# Patient Record
Sex: Female | Born: 1975
Health system: Southern US, Community
[De-identification: ages and names within clinical notes are randomized; demographics above are authoritative.]

## PROBLEM LIST (undated history)

## (undated) DIAGNOSIS — E039 Hypothyroidism, unspecified: Secondary | ICD-10-CM

## (undated) DIAGNOSIS — I499 Cardiac arrhythmia, unspecified: Secondary | ICD-10-CM

## (undated) DIAGNOSIS — J3089 Other allergic rhinitis: Secondary | ICD-10-CM

## (undated) DIAGNOSIS — R51 Headache: Secondary | ICD-10-CM

## (undated) DIAGNOSIS — E785 Hyperlipidemia, unspecified: Secondary | ICD-10-CM

## (undated) DIAGNOSIS — R7303 Prediabetes: Secondary | ICD-10-CM

## (undated) DIAGNOSIS — R519 Headache, unspecified: Secondary | ICD-10-CM

## (undated) DIAGNOSIS — E669 Obesity, unspecified: Secondary | ICD-10-CM

## (undated) DIAGNOSIS — J45909 Unspecified asthma, uncomplicated: Secondary | ICD-10-CM

## (undated) DIAGNOSIS — R000 Tachycardia, unspecified: Secondary | ICD-10-CM

## (undated) DIAGNOSIS — Z8619 Personal history of other infectious and parasitic diseases: Secondary | ICD-10-CM

## (undated) HISTORY — DX: Other allergic rhinitis: J30.89

## (undated) HISTORY — DX: Hypothyroidism, unspecified: E03.9

## (undated) HISTORY — DX: Prediabetes: R73.03

## (undated) HISTORY — DX: Headache: R51

## (undated) HISTORY — DX: Obesity, unspecified: E66.9

## (undated) HISTORY — DX: Unspecified asthma, uncomplicated: J45.909

## (undated) HISTORY — DX: Hyperlipidemia, unspecified: E78.5

## (undated) HISTORY — DX: Cardiac arrhythmia, unspecified: I49.9

## (undated) HISTORY — DX: Tachycardia, unspecified: R00.0

## (undated) HISTORY — DX: Headache, unspecified: R51.9

## (undated) HISTORY — DX: Personal history of other infectious and parasitic diseases: Z86.19

---

## 1997-05-07 HISTORY — PX: APPENDECTOMY: SHX54

## 1997-11-03 ENCOUNTER — Other Ambulatory Visit: Admission: RE | Admit: 1997-11-03 | Discharge: 1997-11-03 | Payer: Self-pay | Admitting: Obstetrics and Gynecology

## 1998-05-07 ENCOUNTER — Inpatient Hospital Stay (HOSPITAL_COMMUNITY): Admission: AD | Admit: 1998-05-07 | Discharge: 1998-05-09 | Payer: Self-pay | Admitting: Obstetrics and Gynecology

## 1998-10-21 ENCOUNTER — Other Ambulatory Visit: Admission: RE | Admit: 1998-10-21 | Discharge: 1998-10-21 | Payer: Self-pay | Admitting: Obstetrics and Gynecology

## 1999-12-06 ENCOUNTER — Other Ambulatory Visit: Admission: RE | Admit: 1999-12-06 | Discharge: 1999-12-06 | Payer: Self-pay | Admitting: Obstetrics and Gynecology

## 2001-01-28 ENCOUNTER — Other Ambulatory Visit: Admission: RE | Admit: 2001-01-28 | Discharge: 2001-01-28 | Payer: Self-pay | Admitting: Obstetrics and Gynecology

## 2002-08-04 ENCOUNTER — Other Ambulatory Visit: Admission: RE | Admit: 2002-08-04 | Discharge: 2002-08-04 | Payer: Self-pay | Admitting: Obstetrics and Gynecology

## 2003-05-04 ENCOUNTER — Encounter: Admission: RE | Admit: 2003-05-04 | Discharge: 2003-05-04 | Payer: Self-pay | Admitting: Urology

## 2003-08-06 ENCOUNTER — Other Ambulatory Visit: Admission: RE | Admit: 2003-08-06 | Discharge: 2003-08-06 | Payer: Self-pay | Admitting: Obstetrics and Gynecology

## 2004-09-13 ENCOUNTER — Other Ambulatory Visit: Admission: RE | Admit: 2004-09-13 | Discharge: 2004-09-13 | Payer: Self-pay | Admitting: Obstetrics and Gynecology

## 2013-03-06 ENCOUNTER — Encounter: Payer: Self-pay | Admitting: Family Medicine

## 2013-03-06 ENCOUNTER — Ambulatory Visit (INDEPENDENT_AMBULATORY_CARE_PROVIDER_SITE_OTHER): Payer: BC Managed Care – PPO | Admitting: Family Medicine

## 2013-03-06 VITALS — BP 128/84 | HR 72 | Temp 98.1°F | Ht 66.0 in | Wt 221.8 lb

## 2013-03-06 DIAGNOSIS — J302 Other seasonal allergic rhinitis: Secondary | ICD-10-CM | POA: Insufficient documentation

## 2013-03-06 DIAGNOSIS — J45909 Unspecified asthma, uncomplicated: Secondary | ICD-10-CM

## 2013-03-06 DIAGNOSIS — E669 Obesity, unspecified: Secondary | ICD-10-CM

## 2013-03-06 DIAGNOSIS — R5383 Other fatigue: Secondary | ICD-10-CM

## 2013-03-06 DIAGNOSIS — Z23 Encounter for immunization: Secondary | ICD-10-CM

## 2013-03-06 DIAGNOSIS — J309 Allergic rhinitis, unspecified: Secondary | ICD-10-CM

## 2013-03-06 DIAGNOSIS — G4733 Obstructive sleep apnea (adult) (pediatric): Secondary | ICD-10-CM | POA: Insufficient documentation

## 2013-03-06 DIAGNOSIS — R5381 Other malaise: Secondary | ICD-10-CM

## 2013-03-06 DIAGNOSIS — G479 Sleep disorder, unspecified: Secondary | ICD-10-CM

## 2013-03-06 DIAGNOSIS — J3089 Other allergic rhinitis: Secondary | ICD-10-CM | POA: Insufficient documentation

## 2013-03-06 DIAGNOSIS — E039 Hypothyroidism, unspecified: Secondary | ICD-10-CM | POA: Insufficient documentation

## 2013-03-06 HISTORY — DX: Obesity, unspecified: E66.9

## 2013-03-06 LAB — BASIC METABOLIC PANEL
BUN: 9 mg/dL (ref 6–23)
CO2: 26 mEq/L (ref 19–32)
Calcium: 9.4 mg/dL (ref 8.4–10.5)
Chloride: 103 mEq/L (ref 96–112)
Creatinine, Ser: 0.7 mg/dL (ref 0.4–1.2)
GFR: 93.15 mL/min (ref >60.00)
Glucose, Bld: 87 mg/dL (ref 70–99)
Potassium: 4 mEq/L (ref 3.5–5.1)
Sodium: 137 mEq/L (ref 135–145)

## 2013-03-06 LAB — LIPID PANEL
Cholesterol: 179 mg/dL (ref 0–200)
HDL: 51.1 mg/dL (ref >39.00)
LDL Cholesterol: 109 mg/dL — ABNORMAL HIGH (ref 0–99)
Total CHOL/HDL Ratio: 4
Triglycerides: 97 mg/dL (ref 0.0–149.0)
VLDL: 19.4 mg/dL (ref 0.0–40.0)

## 2013-03-06 LAB — CBC WITH DIFFERENTIAL/PLATELET
Basophils Absolute: 0 10*3/uL (ref 0.0–0.1)
Basophils Relative: 0.6 % (ref 0.0–3.0)
Eosinophils Absolute: 0.6 10*3/uL (ref 0.0–0.7)
Eosinophils Relative: 6.8 % — ABNORMAL HIGH (ref 0.0–5.0)
HCT: 39.3 % (ref 36.0–46.0)
Hemoglobin: 13.2 g/dL (ref 12.0–15.0)
Lymphocytes Relative: 31.1 % (ref 12.0–46.0)
Lymphs Abs: 2.6 10*3/uL (ref 0.7–4.0)
MCHC: 33.7 g/dL (ref 30.0–36.0)
MCV: 86.3 fl (ref 78.0–100.0)
Monocytes Absolute: 0.3 10*3/uL (ref 0.1–1.0)
Monocytes Relative: 3.6 % (ref 3.0–12.0)
Neutro Abs: 4.8 10*3/uL (ref 1.4–7.7)
Neutrophils Relative %: 57.9 % (ref 43.0–77.0)
Platelets: 262 10*3/uL (ref 150.0–400.0)
RBC: 4.55 Mil/uL (ref 3.87–5.11)
RDW: 12.6 % (ref 11.5–14.6)
WBC: 8.3 10*3/uL (ref 4.5–10.5)

## 2013-03-06 LAB — TSH: TSH: 2.11 u[IU]/mL (ref 0.35–5.50)

## 2013-03-06 NOTE — Assessment & Plan Note (Signed)
Stable on allegra.  ?

## 2013-03-06 NOTE — Assessment & Plan Note (Signed)
Body mass index is 35.81 kg/(m^2).  Discussed watching weight.

## 2013-03-06 NOTE — Assessment & Plan Note (Signed)
Check TSH today

## 2013-03-06 NOTE — Assessment & Plan Note (Signed)
Stable on once daily symbicort and prn albuterol.

## 2013-03-06 NOTE — Assessment & Plan Note (Signed)
Story concerning for OSA. ESI today - 14. Refer to pulmonology for further evaluation.

## 2013-03-06 NOTE — Patient Instructions (Signed)
Flu shot today. Blood work today to check thyroid and fatigue We will refer you to sleep doctor for further evaluation.  If you don't hear from [place] by next week give [place] a call. Good to meet you today! Call us with questions. Come back at your convenience 6 months for physical.

## 2013-03-06 NOTE — Progress Notes (Signed)
Subjective:    Patient ID: Tabitha Spence, female    DOB: 1976/04/18, 37 y.o.   MRN: [id number]  HPI CC: new pt to establish  Prior saw [facility] urgent care.  Hypothyroidism - last checked 6 months ago.  Compliant with synthroid daily.  Stays constipated.  H/o asthma and perennial allergic rhinitis, on allegra.  Staying fatigued.  Snores.  Husband mentions she breathes very heavily when sleeping.  PNDyspnea x1 2 wks ago (vs just louder snore waking her up).  Some daytime sleepiness, sometimes when driving. Sitting on couch easily falls asleep.  nonrestorative sleep. Averages 8-9 hours nightly.  No trouble falling asleep.  Intermittent caffeine.  Wt Readings from Last 3 Encounters:  03/06/13 221 lb 12 oz (100.585 kg)   Body mass index is 35.81 kg/(m^2).  Some blood in stool - and with wiping.  Over last year.  Denies h/o hemorrhoids.  Not related to constipation or food in diet.  Lives with ex-husband and 2 children, 1 dog Occupation: Production designer, theatre/television/film at preschool Edu: college Activity: walking occasionally Diet: good water, fruits/vegetables daily  Preventative: No recent CPE Well woman - last several years ago.  Abnormal pap x1 10 yrs ago, normal since.  Last pap 2012. Tetanus unsure Flu - today. G2P2, 2 NSVD LMP 02/22/2013  Medications and allergies reviewed and updated in chart.  Past histories reviewed and updated if relevant as below. There are no active problems to display for this patient.  Past Medical History  Diagnosis Date  . Asthma   . History of chicken pox   . Frequent headaches   . Perennial allergic rhinitis   . Hypothyroidism    Past Surgical History  Procedure Laterality Date  . Appendectomy  1999   History  Substance Use Topics  . Smoking status: Former Smoker    Start date: 05/08/1999  . Smokeless tobacco: Never Used  . Alcohol Use: No   Family History  Problem Relation Age of Onset  . Cancer Paternal Grandmother     breast   . Cancer Maternal Grandfather     blood  . Hypertension Mother   . Arthritis Maternal Grandmother   . CAD Neg Hx   . Stroke Neg Hx   . Diabetes Neg Hx    No Known Allergies No current outpatient prescriptions on file prior to visit.   No current facility-administered medications on file prior to visit.     Review of Systems  Constitutional: Negative for fever, chills, activity change, appetite change, fatigue and unexpected weight change.  HENT: Negative for hearing loss.   Eyes: Negative for visual disturbance.  Respiratory: Negative for cough, chest tightness, shortness of breath and wheezing.   Cardiovascular: Negative for chest pain, palpitations and leg swelling.  Gastrointestinal: Positive for constipation and blood in stool. Negative for nausea, vomiting, abdominal pain, diarrhea and abdominal distention.  Genitourinary: Negative for hematuria and difficulty urinating.  Musculoskeletal: Negative for arthralgias, myalgias and neck pain.  Skin: Negative for rash.  Neurological: Positive for headaches. Negative for dizziness, seizures and syncope.  Hematological: Negative for adenopathy. Does not bruise/bleed easily.  Psychiatric/Behavioral: Negative for dysphoric mood. The patient is not nervous/anxious.        Objective:   Physical Exam  Nursing note and vitals reviewed. Constitutional: She is oriented to person, place, and time. She appears well-developed and well-nourished. No distress.  obesity  HENT:  Head: Normocephalic and atraumatic.  Right Ear: Hearing, tympanic membrane, external ear and ear canal normal.  Left Ear: Hearing, tympanic membrane, external ear and ear canal normal.  Nose: Nose normal.  Mouth/Throat: Oropharynx is clear and moist. No oropharyngeal exudate.  Eyes: Conjunctivae and EOM are normal. Pupils are equal, round, and reactive to light. No scleral icterus.  Neck: Normal range of motion. Neck supple. No thyromegaly present.  Cardiovascular:  Normal rate, regular rhythm, normal heart sounds and intact distal pulses.   No murmur heard. Pulses:      Radial pulses are 2+ on the right side, and 2+ on the left side.  Pulmonary/Chest: Effort normal and breath sounds normal. No respiratory distress. She has no wheezes. She has no rales.  Abdominal: Soft. Bowel sounds are normal. She exhibits no distension and no mass. There is no tenderness. There is no rebound and no guarding.  Musculoskeletal: Normal range of motion. She exhibits no edema.  Lymphadenopathy:    She has no cervical adenopathy.  Neurological: She is alert and oriented to person, place, and time.  CN grossly intact, station and gait intact  Skin: Skin is warm and dry. No rash noted.  Psychiatric: She has a normal mood and affect. Her behavior is normal. Judgment and thought content normal.       Assessment & Plan:

## 2013-03-09 ENCOUNTER — Encounter: Payer: Self-pay | Admitting: *Deleted

## 2013-03-24 ENCOUNTER — Encounter: Payer: Self-pay | Admitting: Family Medicine

## 2013-03-24 ENCOUNTER — Other Ambulatory Visit: Payer: Self-pay | Admitting: *Deleted

## 2013-03-24 MED ORDER — LEVOTHYROXINE SODIUM 112 MCG PO TABS: 112.0000 ug | ORAL_TABLET | Freq: Every day | ORAL | Status: DC

## 2013-03-24 MED ORDER — ALBUTEROL SULFATE HFA 108 (90 BASE) MCG/ACT IN AERS: 1.0000 | INHALATION_SPRAY | Freq: Four times a day (QID) | RESPIRATORY_TRACT | Status: DC | PRN

## 2013-03-24 MED ORDER — BUDESONIDE-FORMOTEROL FUMARATE 160-4.5 MCG/ACT IN AERO: 2.0000 | INHALATION_SPRAY | Freq: Every day | RESPIRATORY_TRACT | Status: DC

## 2013-04-17 ENCOUNTER — Encounter: Payer: Self-pay | Admitting: Pulmonary Disease

## 2013-04-17 ENCOUNTER — Ambulatory Visit (INDEPENDENT_AMBULATORY_CARE_PROVIDER_SITE_OTHER): Payer: BC Managed Care – PPO | Admitting: Pulmonary Disease

## 2013-04-17 VITALS — BP 138/78 | HR 82 | Temp 98.0°F | Ht 66.0 in | Wt 223.4 lb

## 2013-04-17 DIAGNOSIS — G479 Sleep disorder, unspecified: Secondary | ICD-10-CM

## 2013-04-17 DIAGNOSIS — G4733 Obstructive sleep apnea (adult) (pediatric): Secondary | ICD-10-CM

## 2013-04-17 NOTE — Progress Notes (Signed)
Subjective:    Patient ID: Tabitha Spence, female    DOB: Aug 03, 1975, 37 y.o.   MRN: [id number]  HPI The patient is a 37 year old female who I've been asked to see for possible obstructive sleep apnea. She notes progressive fatigue and sleepiness over the last 2-3 years, and her bed partner has noticed loud snoring and an unusual breathing pattern. She has frequent awakenings at night, and is not rested in the mornings no matter how long she sleeps. She notes definite sleep pressure during the day while at work, and feels extremely fatigued. She will fall asleep very easily after work, such as watching television or movies. She has only occasional sleepiness with driving. The patient's weight is up 10 pounds over the last 2 years, and her Epworth score today is 14.   Sleep Questionnaire What time do you typically go to bed?( Between what hours) 8-9p 8-9p at 1444 on 04/17/13 by Maisie Fus, CMA How long does it take you to fall asleep?  at 1444 on 04/17/13 by Maisie Fus, CMA How many times during the night do you wake up? 4 4 at 1444 on 04/17/13 by Maisie Fus, CMA What time do you get out of bed to start your day? [id number] at 1444 on 04/17/13 by Maisie Fus, CMA Do you drive or operate heavy machinery in your occupation? No No at 1444 on 04/17/13 by Maisie Fus, CMA How much has your weight changed (up or down) over the past two years? (In pounds) 10 lb (4.536 kg)10 lb (4.536 kg) increase at 1444 on 04/17/13 by Maisie Fus, CMA Have you ever had a sleep study before? No No at 1444 on 04/17/13 by Maisie Fus, CMA Do you currently use CPAP? No No at 1444 on 04/17/13 by Maisie Fus, CMA Do you wear oxygen at any time? No No at 1444 on 04/17/13 by Maisie Fus, CMA   Review of Systems  Constitutional: Negative for fever and unexpected weight change.  HENT: Positive for sinus pressure and sneezing. Negative for congestion, dental problem,  ear pain, nosebleeds, postnasal drip, rhinorrhea, sore throat and trouble swallowing.   Eyes: Negative for redness and itching.  Respiratory: Positive for shortness of breath. Negative for cough, chest tightness and wheezing.   Cardiovascular: Negative for palpitations and leg swelling.  Gastrointestinal: Negative for nausea and vomiting.  Genitourinary: Negative for dysuria.  Musculoskeletal: Negative for joint swelling.  Skin: Negative for rash ( itching).  Neurological: Positive for headaches.  Hematological: Does not bruise/bleed easily.  Psychiatric/Behavioral: Negative for dysphoric mood. The patient is not nervous/anxious.        Objective:   Physical Exam Constitutional:  Obese female, no acute distress  HENT:  Nares patent without discharge, but enlarged turbinated with edema.   Oropharynx without exudate, palate and uvula are normal  Eyes:  Perrla, eomi, no scleral icterus  Neck:  No JVD, no TMG  Cardiovascular:  Normal rate, regular rhythm, no rubs or gallops.  No murmurs        Intact distal pulses  Pulmonary :  Normal breath sounds, no stridor or respiratory distress   No rales, rhonchi, or wheezing  Abdominal:  Soft, nondistended, bowel sounds present.  No tenderness noted.   Musculoskeletal:  No lower extremity edema noted.  Lymph Nodes:  No cervical lymphadenopathy noted  Skin:  No cyanosis noted  Neurologic:  Alert, appropriate, moves all 4 extremities without obvious deficit.  Assessment & Plan:

## 2013-04-17 NOTE — Patient Instructions (Signed)
Will arrange for home sleep testing.  Will call once the results are available.

## 2013-04-17 NOTE — Assessment & Plan Note (Signed)
The patient has had progressive sleepiness and tiredness over the last few years, and her history is most suggestive of obstructive sleep apnea. I have had a long discussion with her about sleep apnea, including its impact to her quality of life and cardiovascular health. I think she needs a sleep study for diagnosis, and is a good candidate for home sleep testing. The patient is agreeable to this approach.

## 2013-05-15 DIAGNOSIS — G479 Sleep disorder, unspecified: Secondary | ICD-10-CM

## 2013-05-15 DIAGNOSIS — G4733 Obstructive sleep apnea (adult) (pediatric): Secondary | ICD-10-CM

## 2013-05-21 DIAGNOSIS — G4733 Obstructive sleep apnea (adult) (pediatric): Secondary | ICD-10-CM

## 2013-05-22 ENCOUNTER — Telehealth: Payer: Self-pay | Admitting: Pulmonary Disease

## 2013-05-22 NOTE — Telephone Encounter (Signed)
I spoke with patient about results and she verbalized understanding and had no questions 

## 2013-05-22 NOTE — Telephone Encounter (Signed)
Please let pt know that her sleep study does show a few apneas during the night, but not enough to require treatment.  Is not a health risk for her. Would work hard on weight loss, and avoid sleeping on back if possible.  This should improve. Things.

## 2013-05-26 ENCOUNTER — Encounter: Payer: Self-pay | Admitting: Pulmonary Disease

## 2013-08-20 ENCOUNTER — Other Ambulatory Visit: Payer: Self-pay | Admitting: Family Medicine

## 2013-09-03 ENCOUNTER — Ambulatory Visit (INDEPENDENT_AMBULATORY_CARE_PROVIDER_SITE_OTHER): Payer: PRIVATE HEALTH INSURANCE | Admitting: Family Medicine

## 2013-09-03 ENCOUNTER — Encounter: Payer: Self-pay | Admitting: Family Medicine

## 2013-09-03 VITALS — BP 124/76 | HR 76 | Temp 98.2°F | Ht 66.0 in | Wt 207.5 lb

## 2013-09-03 DIAGNOSIS — E669 Obesity, unspecified: Secondary | ICD-10-CM

## 2013-09-03 DIAGNOSIS — E039 Hypothyroidism, unspecified: Secondary | ICD-10-CM

## 2013-09-03 DIAGNOSIS — J45909 Unspecified asthma, uncomplicated: Secondary | ICD-10-CM

## 2013-09-03 DIAGNOSIS — Z0000 Encounter for general adult medical examination without abnormal findings: Secondary | ICD-10-CM

## 2013-09-03 MED ORDER — BUDESONIDE-FORMOTEROL FUMARATE 80-4.5 MCG/ACT IN AERO: 1.0000 | INHALATION_SPRAY | Freq: Every day | RESPIRATORY_TRACT | Status: DC

## 2013-09-03 NOTE — Assessment & Plan Note (Signed)
Preventative protocols reviewed and updated unless pt declined. Discussed healthy diet and lifestyle.  

## 2013-09-03 NOTE — Assessment & Plan Note (Addendum)
Congratulated on weight loss up to now. Encouraged continued weight loss through healthy diet and regular walking.

## 2013-09-03 NOTE — Progress Notes (Signed)
Pre visit review using our clinic review tool, if applicable. No additional management support is needed unless otherwise documented below in the visit note. 

## 2013-09-03 NOTE — Progress Notes (Signed)
BP 124/76  Pulse 76  Temp(Src) 98.2 F (36.8 C) (Oral)  Ht 5\' 6"  (1.676 m)  Wt 207 lb 8 oz (94.121 kg)  BMI 33.51 kg/m2  LMP 08/06/2013   CC: CPE  Subjective:    Patient ID: Tabitha Spence, female    DOB: 09/10/1975, 38 y.o.   MRN: [id number]  HPI: Tabitha Spence is a 38 y.o. female presenting on 09/03/2013 for Annual Exam   Sleep study - few apneas but no need for CPAP.  rec weight loss and not sleeping on back. Asthma - chronically well controlled, on symbicort 160/4.5 1 puff daily for years.  Rare albuterol use.  Wt Readings from Last 3 Encounters:  09/03/13 207 lb 8 oz (94.121 kg)  04/17/13 223 lb 6.4 oz (101.334 kg)  03/06/13 221 lb 12 oz (100.585 kg)  Body mass index is 33.51 kg/(m^2).  Eating healthier, drinking more water.  Walking 1-2 mi/day 3 times.  Preventative: Well woman - last several years ago. Abnormal pap x1 10 yrs ago, normal since. Last pap 2012. [facility] tdap 2014 Flu - 2014 G2P2, 2 NSVD LMP 08/06/2013 Seat belt use discussed Sunscreen use discussed.  No changing moles.  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 Diet: good water, fruits/vegetables daily  Relevant past medical, surgical, family and social history reviewed and updated as indicated.  Allergies and medications reviewed and updated. Current Outpatient Prescriptions on File Prior to Visit  Medication Sig  . albuterol (PROAIR HFA) 108 (90 BASE) MCG/ACT inhaler Inhale 1-2 puffs into the lungs every 6 (six) hours as needed.  . fexofenadine (ALLEGRA) 180 MG tablet Take 180 mg by mouth daily.  Marland Kitchen. levothyroxine (SYNTHROID, LEVOTHROID) 112 MCG tablet TAKE 1 TABLET BY MOUTH DAILY BEFORE BREAKFAST.   No current facility-administered medications on file prior to visit.    Review of Systems  Constitutional: Negative for fever, chills, activity change,  appetite change, fatigue and unexpected weight change.  HENT: Negative for hearing loss.   Eyes: Negative for visual disturbance.  Respiratory: Negative for cough, chest tightness, shortness of breath and wheezing.   Cardiovascular: Negative for chest pain, palpitations and leg swelling.  Gastrointestinal: Negative for nausea, vomiting, abdominal pain, diarrhea, constipation, blood in stool and abdominal distention.  Genitourinary: Negative for hematuria and difficulty urinating.  Musculoskeletal: Negative for arthralgias, myalgias and neck pain.  Skin: Negative for rash.  Neurological: Negative for dizziness, seizures, syncope and headaches.  Hematological: Negative for adenopathy. Does not bruise/bleed easily.  Psychiatric/Behavioral: Negative for dysphoric mood. The patient is not nervous/anxious.    Per HPI unless specifically indicated above    Objective:    BP 124/76  Pulse 76  Temp(Src) 98.2 F (36.8 C) (Oral)  Ht 5\' 6"  (1.676 m)  Wt 207 lb 8 oz (94.121 kg)  BMI 33.51 kg/m2  LMP 08/06/2013  Physical Exam  Nursing note and vitals reviewed. Constitutional: She is oriented to person, place, and time. She appears well-developed and well-nourished. No distress.  HENT:  Head: Normocephalic and atraumatic.  Right Ear: Hearing, tympanic membrane, external ear and ear canal normal.  Left Ear: Hearing, tympanic membrane, external ear and ear canal normal.  Nose: Nose normal.  Mouth/Throat: Uvula is midline, oropharynx is clear and moist and mucous membranes are normal. No oropharyngeal exudate, posterior oropharyngeal edema or posterior oropharyngeal erythema.  Eyes: Conjunctivae  and EOM are normal. Pupils are equal, round, and reactive to light. No scleral icterus.  Neck: Normal range of motion. Neck supple. No thyromegaly present.  Cardiovascular: Normal rate, regular rhythm, normal heart sounds and intact distal pulses.   No murmur heard. Pulses:      Radial pulses are 2+ on  the right side, and 2+ on the left side.  Pulmonary/Chest: Effort normal and breath sounds normal. No respiratory distress. She has no wheezes. She has no rales.  Abdominal: Soft. Bowel sounds are normal. She exhibits no distension and no mass. There is no tenderness. There is no rebound and no guarding.  Musculoskeletal: Normal range of motion. She exhibits no edema.  Lymphadenopathy:    She has no cervical adenopathy.  Neurological: She is alert and oriented to person, place, and time.  CN grossly intact, station and gait intact  Skin: Skin is warm and dry. No rash noted.  Psychiatric: She has a normal mood and affect. Her behavior is normal. Judgment and thought content normal.   Results for orders placed in visit on 03/06/13  LIPID PANEL      Result Value Ref Range   Cholesterol 179  0 - 200 mg/dL   Triglycerides 16.197.0  0.0 - 149.0 mg/dL   HDL 09.6051.10  >45.40>39.00 mg/dL   VLDL 98.119.4  0.0 - 19.140.0 mg/dL   LDL Cholesterol 478109 (*) 0 - 99 mg/dL   Total CHOL/HDL Ratio 4    BASIC METABOLIC PANEL      Result Value Ref Range   Sodium 137  135 - 145 mEq/L   Potassium 4.0  3.5 - 5.1 mEq/L   Chloride 103  96 - 112 mEq/L   CO2 26  19 - 32 mEq/L   Glucose, Bld 87  70 - 99 mg/dL   BUN 9  6 - 23 mg/dL   Creatinine, Ser 0.7  0.4 - 1.2 mg/dL   Calcium 9.4  8.4 - 29.510.5 mg/dL   GFR 62.1393.15  >08.65>60.00 mL/min  TSH      Result Value Ref Range   TSH 2.11  0.35 - 5.50 uIU/mL  CBC WITH DIFFERENTIAL      Result Value Ref Range   WBC 8.3  4.5 - 10.5 K/uL   RBC 4.55  3.87 - 5.11 Mil/uL   Hemoglobin 13.2  12.0 - 15.0 g/dL   HCT 78.439.3  69.636.0 - 29.546.0 %   MCV 86.3  78.0 - 100.0 fl   MCHC 33.7  30.0 - 36.0 g/dL   RDW 28.412.6  13.211.5 - 44.014.6 %   Platelets 262.0  150.0 - 400.0 K/uL   Neutrophils Relative % 57.9  43.0 - 77.0 %   Lymphocytes Relative 31.1  12.0 - 46.0 %   Monocytes Relative 3.6  3.0 - 12.0 %   Eosinophils Relative 6.8 (*) 0.0 - 5.0 %   Basophils Relative 0.6  0.0 - 3.0 %   Neutro Abs 4.8  1.4 - 7.7 K/uL    Lymphs Abs 2.6  0.7 - 4.0 K/uL   Monocytes Absolute 0.3  0.1 - 1.0 K/uL   Eosinophils Absolute 0.6  0.0 - 0.7 K/uL   Basophils Absolute 0.0  0.0 - 0.1 K/uL      Assessment & Plan:   Problem List Items Addressed This Visit   Obesity     Congratulated on weight loss up to now. Encouraged continued weight loss through healthy diet and regular walking.    Hypothyroidism  Lab Results  Component Value Date   TSH 2.11 03/06/2013  continue med.    Asthma     Chronically stable on current symbicort dose. Will decrease symbicort to 80/4.5mg  one inhalation daily Pt will update me with effect in next few months    Relevant Medications      budesonide-formoterol (SYMBICORT) 80-4.5 MCG/ACT inhaler   Health maintenance examination - Primary     Preventative protocols reviewed and updated unless pt declined. Discussed healthy diet and lifestyle.        Follow up plan: Return in about 2 years (around 09/04/2015), or as needed, for annual exam, prior fasting for blood work.

## 2013-09-03 NOTE — Patient Instructions (Addendum)
Congratulations on healthier diet and increased activity! Continue good work. No blood work needed today. Return as needed or in 1-2 years for physical. Call us with questions. Check with pharmacist on cheaper alternatives to symbicort (advair, breo).

## 2013-09-03 NOTE — Assessment & Plan Note (Signed)
Lab Results  Component Value Date   TSH 2.11 03/06/2013  continue med.

## 2013-09-03 NOTE — Assessment & Plan Note (Signed)
Chronically stable on current symbicort dose. Will decrease symbicort to 80/4.5mg  one inhalation daily Pt will update me with effect in next few months

## 2013-12-11 ENCOUNTER — Ambulatory Visit (INDEPENDENT_AMBULATORY_CARE_PROVIDER_SITE_OTHER): Payer: PRIVATE HEALTH INSURANCE | Admitting: Internal Medicine

## 2013-12-11 ENCOUNTER — Encounter: Payer: Self-pay | Admitting: Internal Medicine

## 2013-12-11 VITALS — BP 100/78 | HR 90 | Temp 98.1°F | Wt 211.0 lb

## 2013-12-11 DIAGNOSIS — J019 Acute sinusitis, unspecified: Secondary | ICD-10-CM | POA: Insufficient documentation

## 2013-12-11 DIAGNOSIS — J029 Acute pharyngitis, unspecified: Secondary | ICD-10-CM | POA: Insufficient documentation

## 2013-12-11 LAB — POCT RAPID STREP A (OFFICE): Rapid Strep A Screen: POSITIVE — AB

## 2013-12-11 MED ORDER — AMOXICILLIN 500 MG PO TABS: 1000.0000 mg | ORAL_TABLET | Freq: Two times a day (BID) | ORAL | Status: DC

## 2013-12-11 NOTE — Progress Notes (Signed)
Pre visit review using our clinic review tool, if applicable. No additional management support is needed unless otherwise documented below in the visit note. 

## 2013-12-11 NOTE — Assessment & Plan Note (Signed)
Close strep exposure and test is positive (though clinical history is not that supportive) Some degree of sinus symptoms as well so will treat with amoxil Continue supportive

## 2013-12-11 NOTE — Progress Notes (Signed)
   Subjective:    Patient ID: Tabitha MattesLaurie J Word, female    DOB: 05/12/1975, 38 y.o.   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 albuterol (PROAIR HFA) 108 (90 BASE) MCG/ACT inhaler Inhale 1-2 puffs into the lungs every 6 (six) hours as needed.  1 Inhaler  1  . budesonide-formoterol (SYMBICORT) 80-4.5 MCG/ACT inhaler Inhale 1-2 puffs into the lungs daily.  1 Inhaler  6  . fexofenadine (ALLEGRA) 180 MG tablet Take 180 mg by mouth daily.      Marland Kitchen. levothyroxine (SYNTHROID, LEVOTHROID) 112 MCG tablet TAKE 1 TABLET BY MOUTH DAILY BEFORE BREAKFAST.  30 tablet  3   No current facility-administered medications on file prior to visit.    No Known Allergies  Past Medical History  Diagnosis Date  . Asthma   . History of chicken pox   . Frequent headaches   . Perennial allergic rhinitis   . Hypothyroidism   . Obesity 03/06/2013    Past Surgical History  Procedure Laterality Date  . Appendectomy  1999    Family History  Problem Relation Age of Onset  . Cancer Paternal Grandmother     breast  . Cancer Maternal Grandfather     blood  . Hypertension Mother   . Arthritis Maternal Grandmother   . CAD Neg Hx   . Stroke Neg Hx   . Diabetes Neg Hx     History   Social History  . Marital Status: Divorced    Spouse Name: N/A    Number of Children: N/A  . Years of Education: N/A   Occupational History  . Asst Director at [employer]    Social History Main Topics  . Smoking status: Former Smoker -- 1.00  packs/day for 8 years    Types: Cigarettes    Quit date: 05/08/1999  . Smokeless tobacco: Never Used  . Alcohol Use: No  . Drug Use: No  . Sexual Activity: Not on file   Other Topics Concern  . 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 No distress.  HENT:  Mild maxillary tenderness TMs normal Mild nasal swelling Pharynx is injected but no tonsillar enlargement or exudates  Neck: Normal range of motion. Neck supple. No thyromegaly present.  Pulmonary/Chest: Effort normal and breath sounds normal. No respiratory distress. She has no wheezes. She has no rales.  Lymphadenopathy:    She has no cervical adenopathy.  Skin: No rash noted.          Assessment & Plan:

## 2014-01-15 ENCOUNTER — Other Ambulatory Visit: Payer: Self-pay | Admitting: Family Medicine

## 2014-03-15 ENCOUNTER — Other Ambulatory Visit: Payer: Self-pay | Admitting: Family Medicine

## 2014-05-25 ENCOUNTER — Other Ambulatory Visit: Payer: Self-pay | Admitting: Family Medicine

## 2014-07-27 ENCOUNTER — Encounter: Payer: Self-pay | Admitting: Family Medicine

## 2014-07-27 ENCOUNTER — Ambulatory Visit (INDEPENDENT_AMBULATORY_CARE_PROVIDER_SITE_OTHER): Payer: PRIVATE HEALTH INSURANCE | Admitting: Family Medicine

## 2014-07-27 VITALS — BP 116/72 | HR 87 | Temp 98.1°F | Resp 18 | Wt 228.1 lb

## 2014-07-27 DIAGNOSIS — R0989 Other specified symptoms and signs involving the circulatory and respiratory systems: Secondary | ICD-10-CM

## 2014-07-27 DIAGNOSIS — F458 Other somatoform disorders: Secondary | ICD-10-CM

## 2014-07-27 DIAGNOSIS — R09A2 Foreign body sensation, throat: Secondary | ICD-10-CM | POA: Insufficient documentation

## 2014-07-27 DIAGNOSIS — E039 Hypothyroidism, unspecified: Secondary | ICD-10-CM | POA: Diagnosis not present

## 2014-07-27 MED ORDER — FLUTICASONE PROPIONATE 50 MCG/ACT NA SUSP: 2.0000 | Freq: Every day | NASAL | Status: DC

## 2014-07-27 NOTE — Assessment & Plan Note (Addendum)
Overdue for recheck - check TSH and free T4 today. Multiple sxs indicating under-treated hypothyroidism.

## 2014-07-27 NOTE — Progress Notes (Signed)
BP 116/72 mmHg  Pulse 87  Temp(Src) 98.1 F (36.7 C) (Oral)  Resp 18  Wt 228 lb 1.9 oz (103.475 kg)  SpO2 96%  LMP 07/27/2014   CC: globus sensation  Subjective:    Patient ID: Tabitha Spence, female    DOB: 02/25/1976, 39 y.o.   MRN: [id number]  HPI: Tabitha Spence is a 39 y.o. female presenting on 07/27/2014 for Swallowed Foreign Body   "I feel something stuck in my throat" ongoing for 2 weeks. More noticeable when laying flat. Had to sleep last night upright otherwise felt she was choking. Denies fevers, cough, congestion. Cold sxs for last 2 days. Ongoing PNdrainage. No dysphagia. Occasional GERD.   Compliant with thyroid med. Stays cold. + weight gain over last several months. Occasional constipation. Denies skin or hair changes. No exposure to iodine recently, no seaweed ingestion. No radiation exposure history. No fmhx thyroid disease. Ongoing hypothyroidism for last 5-7 yrs  Sleep study done last year - few apneas but no need for CPAP. rec weight loss and side sleeping.   Asthma - chronically well controlled, on symbicort 160/4.5 1 puff daily for years. Rare albuterol use.  Lab Results  Component Value Date   TSH 2.11 03/06/2013    Relevant past medical, surgical, family and social history reviewed and updated as indicated. Interim medical history since our last visit reviewed. Allergies and medications reviewed and updated. Current Outpatient Prescriptions on File Prior to Visit  Medication Sig  . albuterol (PROAIR HFA) 108 (90 BASE) MCG/ACT inhaler Inhale 1-2 puffs into the lungs every 6 (six) hours as needed.  . budesonide-formoterol (SYMBICORT) 80-4.5 MCG/ACT inhaler Inhale 1-2 puffs into the lungs daily.  . fexofenadine (ALLEGRA) 180 MG tablet Take 180 mg by mouth daily.  Marland Kitchen. levothyroxine (SYNTHROID, LEVOTHROID) 112 MCG tablet TAKE 1 TABLET BY MOUTH DAILY BEFORE BREAKFAST.   No current facility-administered medications on file prior to visit.    Review of  Systems Per HPI unless specifically indicated above     Objective:    BP 116/72 mmHg  Pulse 87  Temp(Src) 98.1 F (36.7 C) (Oral)  Resp 18  Wt 228 lb 1.9 oz (103.475 kg)  SpO2 96%  LMP 07/27/2014  Wt Readings from Last 3 Encounters:  07/27/14 228 lb 1.9 oz (103.475 kg)  12/11/13 211 lb (95.709 kg)  09/03/13 207 lb 8 oz (94.121 kg)    Physical Exam  Constitutional: She appears well-developed and well-nourished. No distress.  HENT:  Head: Normocephalic and atraumatic.  Right Ear: Hearing, tympanic membrane, external ear and ear canal normal.  Left Ear: Hearing, tympanic membrane, external ear and ear canal normal.  Nose: Mucosal edema (and pallor) and rhinorrhea present.  Mouth/Throat: Uvula is midline, oropharynx is clear and moist and mucous membranes are normal. No oropharyngeal exudate, posterior oropharyngeal edema, posterior oropharyngeal erythema or tonsillar abscesses.  Eyes: Conjunctivae and EOM are normal. Pupils are equal, round, and reactive to light. No scleral icterus.  Neck: Normal range of motion. Neck supple. Thyromegaly (slight possibly but no nodule) present.  Cardiovascular: Normal rate, regular rhythm, normal heart sounds and intact distal pulses.   No murmur heard. Pulmonary/Chest: Effort normal and breath sounds normal. No respiratory distress. She has no wheezes. She has no rales.  Lymphadenopathy:    She has no cervical adenopathy.  Skin: Skin is warm and dry. No rash noted.  Nursing note and vitals reviewed.      Assessment & Plan:   Problem List Items  Addressed This Visit    Hypothyroidism    Overdue for recheck - check TSH and free T4 today. Multiple sxs indicating under-treated hypothyroidism.      Relevant Orders   US Soft Tissue Head/Neck   TSH   T4, free   Globus sensation - Primary    ?goiter related vs allergic rhinitis vs hidden GERD Check thyroid US today to eval for thyromegaly. Start flonase and nasal saline to treat possible  allergic rhinitis cause. Exam today overall reassuring. Update in 1-2 wks with effect of flonase.      Relevant Orders   US Soft Tissue Head/Neck       Follow up plan: Return if symptoms worsen or fail to improve.

## 2014-07-27 NOTE — Patient Instructions (Signed)
Let's treat possible allergic rhinitis with post nasal drainage with nasal saline and flonase (sent to pharmacy). TSH check today. We will schedule thyroid ultrasound as well to rule out enlargement of gland as cause of symptoms. Update us in 1-2 weeks with effect.

## 2014-07-27 NOTE — Assessment & Plan Note (Signed)
?  goiter related vs allergic rhinitis vs hidden GERD Check thyroid US today to eval for thyromegaly. Start flonase and nasal saline to treat possible allergic rhinitis cause. Exam today overall reassuring. Update in 1-2 wks with effect of flonase.

## 2014-07-28 LAB — T4, FREE: Free T4: 0.85 ng/dL (ref 0.60–1.60)

## 2014-07-28 LAB — TSH: TSH: 2.75 u[IU]/mL (ref 0.35–4.50)

## 2014-07-29 ENCOUNTER — Ambulatory Visit: Payer: Self-pay | Admitting: Family Medicine

## 2014-07-30 ENCOUNTER — Ambulatory Visit: Payer: Self-pay | Admitting: Family Medicine

## 2014-08-02 ENCOUNTER — Encounter: Payer: Self-pay | Admitting: Family Medicine

## 2014-10-01 ENCOUNTER — Other Ambulatory Visit: Payer: Self-pay | Admitting: Family Medicine

## 2014-11-05 LAB — HM MAMMOGRAPHY: HM Mammogram: NORMAL (ref 0–4)

## 2014-11-05 LAB — HM PAP SMEAR: HM Pap smear: NORMAL

## 2014-11-16 ENCOUNTER — Other Ambulatory Visit: Payer: Self-pay | Admitting: Family Medicine

## 2014-11-18 ENCOUNTER — Telehealth: Payer: Self-pay | Admitting: Family Medicine

## 2014-11-18 NOTE — Telephone Encounter (Signed)
Paperwork faxed to patient as requested.

## 2014-11-18 NOTE — Telephone Encounter (Signed)
Signed and in Kim's box. 

## 2014-11-18 NOTE — Telephone Encounter (Signed)
Pt dropped off paperwork that she needs to be filled out for a prescription assistance program. She states on the last page that you can fax it back to her at [phone] or call her to come pick it up. The best number is [phone]. Placing papers on Kim's desk.

## 2014-11-18 NOTE — Telephone Encounter (Signed)
In your IN box for completion.  

## 2015-08-05 ENCOUNTER — Other Ambulatory Visit: Payer: Self-pay | Admitting: Family Medicine

## 2015-09-02 ENCOUNTER — Encounter: Payer: Self-pay | Admitting: Family Medicine

## 2015-09-02 ENCOUNTER — Ambulatory Visit (INDEPENDENT_AMBULATORY_CARE_PROVIDER_SITE_OTHER): Payer: PRIVATE HEALTH INSURANCE | Admitting: Family Medicine

## 2015-09-02 VITALS — BP 116/82 | HR 88 | Temp 98.2°F | Ht 66.0 in | Wt 222.2 lb

## 2015-09-02 DIAGNOSIS — J3089 Other allergic rhinitis: Secondary | ICD-10-CM

## 2015-09-02 DIAGNOSIS — E039 Hypothyroidism, unspecified: Secondary | ICD-10-CM

## 2015-09-02 DIAGNOSIS — J4520 Mild intermittent asthma, uncomplicated: Secondary | ICD-10-CM

## 2015-09-02 DIAGNOSIS — IMO0001 Reserved for inherently not codable concepts without codable children: Secondary | ICD-10-CM

## 2015-09-02 DIAGNOSIS — J309 Allergic rhinitis, unspecified: Secondary | ICD-10-CM

## 2015-09-02 DIAGNOSIS — Z0000 Encounter for general adult medical examination without abnormal findings: Secondary | ICD-10-CM

## 2015-09-02 LAB — TSH: TSH: 3.46 mIU/L

## 2015-09-02 LAB — COMPREHENSIVE METABOLIC PANEL
ALT: 20 U/L (ref 6–29)
AST: 19 U/L (ref 10–30)
Albumin: 4.1 g/dL (ref 3.6–5.1)
Alkaline Phosphatase: 72 U/L (ref 33–115)
BUN: 14 mg/dL (ref 7–25)
CO2: 26 mmol/L (ref 20–31)
Calcium: 9.3 mg/dL (ref 8.6–10.2)
Chloride: 104 mmol/L (ref 98–110)
Creat: 0.88 mg/dL (ref 0.50–1.10)
Glucose, Bld: 92 mg/dL (ref 65–99)
Potassium: 4.3 mmol/L (ref 3.5–5.3)
Sodium: 141 mmol/L (ref 135–146)
Total Bilirubin: 0.4 mg/dL (ref 0.2–1.2)
Total Protein: 7.1 g/dL (ref 6.1–8.1)

## 2015-09-02 LAB — LIPID PANEL
Cholesterol: 154 mg/dL (ref 125–200)
HDL: 49 mg/dL (ref >=46)
LDL Cholesterol: 89 mg/dL (ref <130)
Total CHOL/HDL Ratio: 3.1 Ratio (ref <=5.0)
Triglycerides: 82 mg/dL (ref <150)
VLDL: 16 mg/dL (ref <30)

## 2015-09-02 LAB — T4, FREE: Free T4: 1.1 ng/dL (ref 0.8–1.8)

## 2015-09-02 LAB — T3: T3, Total: 84 ng/dL (ref 76–181)

## 2015-09-02 NOTE — Assessment & Plan Note (Addendum)
Stable on symbicort daily. [facility]

## 2015-09-02 NOTE — Progress Notes (Signed)
Pre visit review using our clinic review tool, if applicable. No additional management support is needed unless otherwise documented below in the visit note. 

## 2015-09-02 NOTE — Assessment & Plan Note (Signed)
Preventative protocols reviewed and updated unless pt declined. Discussed healthy diet and lifestyle.  

## 2015-09-02 NOTE — Progress Notes (Signed)
BP 116/82 mmHg  Pulse 88  Temp(Src) 98.2 F (36.8 C) (Oral)  Ht 5\' 6"  (1.676 m)  Wt 222 lb 4 oz (100.812 kg)  BMI 35.89 kg/m2  LMP 08/28/2015   CC: CPE  Subjective:    Patient ID: Nonah MattesLaurie J Turnipseed, female    DOB: 01/07/1976, 40 y.o.   MRN: [id number]  HPI: Nonah MattesLaurie J Mcgahee is a 40 y.o. female presenting on 09/02/2015 for Annual Exam   Last meal was breakfast 10am.  Asthma well controlled with symbicort 80 1-2 puffs BID. Triggers are strong smells and some allergies.  Spot on face present for last 6 months.  Hypothyroidism - stable on current regimen.   Preventative: Well woman [facility]. Normal pap smear. Mammogram normal 11/2014 Flu shot intermittently Tdap 2014 G2P2, 2 NSVD LMP 08/28/2015 Seat belt use discussed Sunscreen use discussed. No changing moles.  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 Diet: good water, fruits/vegetables daily, 1200cal/day diet  Relevant past medical, surgical, family and social history reviewed and updated as indicated. Interim medical history since our last visit reviewed. Allergies and medications reviewed and updated. Current Outpatient Prescriptions on File Prior to Visit  Medication Sig  . albuterol (PROAIR HFA) 108 (90 BASE) MCG/ACT inhaler Inhale 1-2 puffs into the lungs every 6 (six) hours as needed.  Marland Kitchen. levothyroxine (SYNTHROID, LEVOTHROID) 112 MCG tablet Take one tablet daily **MUST HAVE PHYSICAL FOR FURTHER REFILLS**   No current facility-administered medications on file prior to visit.    Review of Systems  Constitutional: Negative for fever, chills, activity change, appetite change, fatigue and unexpected weight change.  HENT: Negative for hearing loss.   Eyes: Negative for visual disturbance.  Respiratory: Negative for cough, chest tightness, shortness of breath and wheezing.   Cardiovascular:  Negative for chest pain, palpitations and leg swelling.  Gastrointestinal: Negative for nausea, vomiting, abdominal pain, diarrhea, constipation, blood in stool and abdominal distention.  Genitourinary: Negative for hematuria and difficulty urinating.  Musculoskeletal: Negative for myalgias, arthralgias and neck pain.  Skin: Negative for rash.  Neurological: Negative for dizziness, seizures, syncope and headaches.  Hematological: Negative for adenopathy. Does not bruise/bleed easily.  Psychiatric/Behavioral: Negative for dysphoric mood. The patient is not nervous/anxious.    Per HPI unless specifically indicated in ROS section     Objective:    BP 116/82 mmHg  Pulse 88  Temp(Src) 98.2 F (36.8 C) (Oral)  Ht 5\' 6"  (1.676 m)  Wt 222 lb 4 oz (100.812 kg)  BMI 35.89 kg/m2  LMP 08/28/2015  Wt Readings from Last 3 Encounters:  09/02/15 222 lb 4 oz (100.812 kg)  07/27/14 228 lb 1.9 oz (103.475 kg)  12/11/13 211 lb (95.709 kg)    Physical Exam  Constitutional: She is oriented to person, place, and time. She appears well-developed and well-nourished. No distress.  HENT:  Head: Normocephalic and atraumatic.  Right Ear: Hearing, tympanic membrane, external ear and ear canal normal.  Left Ear: Hearing, tympanic membrane, external ear and ear canal normal.  Nose: Nose normal.  Mouth/Throat: Uvula is midline, oropharynx is clear and moist and mucous membranes are normal. No oropharyngeal exudate, posterior oropharyngeal edema or posterior oropharyngeal erythema.  Eyes: Conjunctivae and EOM are normal. Pupils are equal, round, and reactive to light. No scleral icterus.  Neck: Normal range of motion. Neck supple. No thyromegaly present.  Cardiovascular: Normal rate, regular  rhythm, normal heart sounds and intact distal pulses.   No murmur heard. Pulses:      Radial pulses are 2+ on the right side, and 2+ on the left side.  Pulmonary/Chest: Effort normal and breath sounds normal. No  respiratory distress. She has no wheezes. She has no rales.  Abdominal: Soft. Bowel sounds are normal. She exhibits no distension and no mass. There is no tenderness. There is no rebound and no guarding.  Musculoskeletal: Normal range of motion. She exhibits no edema.  Lymphadenopathy:    She has no cervical adenopathy.  Neurological: She is alert and oriented to person, place, and time.  CN grossly intact, station and gait intact  Skin: Skin is warm and dry. No rash noted.  Psychiatric: She has a normal mood and affect. Her behavior is normal. Judgment and thought content normal.  Nursing note and vitals reviewed.  Results for orders placed or performed in visit on 09/02/15  HM MAMMOGRAPHY  Result Value Ref Range   HM Mammogram Self Reported Normal 0-4 Bi-Rad, Self Reported Normal  HM PAP SMEAR  Result Value Ref Range   HM Pap smear normal       Assessment & Plan:   Problem List Items Addressed This Visit    Obesity, Class II, BMI 35-39.9, with comorbidity (HCC)    Reviewed healthy diet and lifestyle changes to date - 1200cal diet, 2 mi walking several times a week.       Relevant Orders   Lipid panel   Comprehensive metabolic panel   Hypothyroidism    Check TFTs today. Refill levothyroxine based on labs.      Relevant Orders   TSH   T3   T4, free   Asthma    Stable on symbicort daily. Pt will send Rx through [facility]      Relevant Medications   budesonide-formoterol (SYMBICORT) 80-4.5 MCG/ACT inhaler   Perennial allergic rhinitis    Doing well with allegra D      Health maintenance examination - Primary    Preventative protocols reviewed and updated unless pt declined. Discussed healthy diet and lifestyle.           Follow up plan: Return in about 1 year (around 09/01/2016), or as needed, for annual exam, prior fasting for blood work.  Eustaquio Boyden, MD

## 2015-09-02 NOTE — Assessment & Plan Note (Signed)
Reviewed healthy diet and lifestyle changes to date - 1200cal diet, 2 mi walking several times a week.

## 2015-09-02 NOTE — Assessment & Plan Note (Addendum)
Check TFTs today. Refill levothyroxine based on labs.

## 2015-09-02 NOTE — Assessment & Plan Note (Signed)
Doing well with allegra D

## 2015-09-02 NOTE — Patient Instructions (Addendum)
You are doing well today We will refill levothyroxine after labs return. Symbicort Rx signed today.  Return as needed or in 1 year for next physical.  Health Maintenance, Female Adopting a healthy lifestyle and getting preventive care can go a long way to promote health and wellness. Talk with your health care provider about what schedule of regular examinations is right for you. This is a good chance for you to check in with your provider about disease prevention and staying healthy. In between checkups, there are plenty of things you can do on your own. Experts have done a lot of research about which lifestyle changes and preventive measures are most likely to keep you healthy. Ask your health care provider for more information. WEIGHT AND DIET  Eat a healthy diet  Be sure to include plenty of vegetables, fruits, low-fat dairy products, and lean protein.  Do not eat a lot of foods high in solid fats, added sugars, or salt.  Get regular exercise. This is one of the most important things you can do for your health.  Most adults should exercise for at least 150 minutes each week. The exercise should increase your heart rate and make you sweat (moderate-intensity exercise).  Most adults should also do strengthening exercises at least twice a week. This is in addition to the moderate-intensity exercise.  Maintain a healthy weight  Body mass index (BMI) is a measurement that can be used to identify possible weight problems. It estimates body fat based on height and weight. Your health care provider can help determine your BMI and help you achieve or maintain a healthy weight.  For females 75 years of age and older:   A BMI below 18.5 is considered underweight.  A BMI of 18.5 to 24.9 is normal.  A BMI of 25 to 29.9 is considered overweight.  A BMI of 30 and above is considered obese.  Watch levels of cholesterol and blood lipids  You should start having your blood tested for lipids  and cholesterol at 41 years of age, then have this test every 5 years.  You may need to have your cholesterol levels checked more often if:  Your lipid or cholesterol levels are high.  You are older than 41 years of age.  You are at high risk for heart disease.  CANCER SCREENING   Lung Cancer  Lung cancer screening is recommended for adults 58-6 years old who are at high risk for lung cancer because of a history of smoking.  A yearly low-dose CT scan of the lungs is recommended for people who:  Currently smoke.  Have quit within the past 15 years.  Have at least a 30-pack-year history of smoking. A pack year is smoking an average of one pack of cigarettes a day for 1 year.  Yearly screening should continue until it has been 15 years since you quit.  Yearly screening should stop if you develop a health problem that would prevent you from having lung cancer treatment.  Breast Cancer  Practice breast self-awareness. This means understanding how your breasts normally appear and feel.  It also means doing regular breast self-exams. Let your health care provider know about any changes, no matter how small.  If you are in your 20s or 30s, you should have a clinical breast exam (CBE) by a health care provider every 1-3 years as part of a regular health exam.  If you are 49 or older, have a CBE every year. Also consider having  a breast X-ray (mammogram) every year.  If you have a family history of breast cancer, talk to your health care provider about genetic screening.  If you are at high risk for breast cancer, talk to your health care provider about having an MRI and a mammogram every year.  Breast cancer gene (BRCA) assessment is recommended for women who have family members with BRCA-related cancers. BRCA-related cancers include:  Breast.  Ovarian.  Tubal.  Peritoneal cancers.  Results of the assessment will determine the need for genetic counseling and BRCA1 and  BRCA2 testing. Cervical Cancer Your health care provider may recommend that you be screened regularly for cancer of the pelvic organs (ovaries, uterus, and vagina). This screening involves a pelvic examination, including checking for microscopic changes to the surface of your cervix (Pap test). You may be encouraged to have this screening done every 3 years, beginning at age 41.  For women ages 69-65, health care providers may recommend pelvic exams and Pap testing every 3 years, or they may recommend the Pap and pelvic exam, combined with testing for human papilloma virus (HPV), every 5 years. Some types of HPV increase your risk of cervical cancer. Testing for HPV may also be done on women of any age with unclear Pap test results.  Other health care providers may not recommend any screening for nonpregnant women who are considered low risk for pelvic cancer and who do not have symptoms. Ask your health care provider if a screening pelvic exam is right for you.  If you have had past treatment for cervical cancer or a condition that could lead to cancer, you need Pap tests and screening for cancer for at least 20 years after your treatment. If Pap tests have been discontinued, your risk factors (such as having a new sexual partner) need to be reassessed to determine if screening should resume. Some women have medical problems that increase the chance of getting cervical cancer. In these cases, your health care provider may recommend more frequent screening and Pap tests. Colorectal Cancer  This type of cancer can be detected and often prevented.  Routine colorectal cancer screening usually begins at 41 years of age and continues through 41 years of age.  Your health care provider may recommend screening at an earlier age if you have risk factors for colon cancer.  Your health care provider may also recommend using home test kits to check for hidden blood in the stool.  A small camera at the end of  a tube can be used to examine your colon directly (sigmoidoscopy or colonoscopy). This is done to check for the earliest forms of colorectal cancer.  Routine screening usually begins at age 66.  Direct examination of the colon should be repeated every 5-10 years through 41 years of age. However, you may need to be screened more often if early forms of precancerous polyps or small growths are found. Skin Cancer  Check your skin from head to toe regularly.  Tell your health care provider about any new moles or changes in moles, especially if there is a change in a mole's shape or color.  Also tell your health care provider if you have a mole that is larger than the size of a pencil eraser.  Always use sunscreen. Apply sunscreen liberally and repeatedly throughout the day.  Protect yourself by wearing long sleeves, pants, a wide-brimmed hat, and sunglasses whenever you are outside. HEART DISEASE, DIABETES, AND HIGH BLOOD PRESSURE   High blood pressure  causes heart disease and increases the risk of stroke. High blood pressure is more likely to develop in:  People who have blood pressure in the high end of the normal range (130-139/85-89 mm Hg).  People who are overweight or obese.  People who are African American.  If you are 18-39 years of age, have your blood pressure checked every 3-5 years. If you are 40 years of age or older, have your blood pressure checked every year. You should have your blood pressure measured twice--once when you are at a hospital or clinic, and once when you are not at a hospital or clinic. Record the average of the two measurements. To check your blood pressure when you are not at a hospital or clinic, you can use:  An automated blood pressure machine at a pharmacy.  A home blood pressure monitor.  If you are between 55 years and 79 years old, ask your health care provider if you should take aspirin to prevent strokes.  Have regular diabetes screenings. This  involves taking a blood sample to check your fasting blood sugar level.  If you are at a normal weight and have a low risk for diabetes, have this test once every three years after 41 years of age.  If you are overweight and have a high risk for diabetes, consider being tested at a younger age or more often. PREVENTING INFECTION  Hepatitis B  If you have a higher risk for hepatitis B, you should be screened for this virus. You are considered at high risk for hepatitis B if:  You were born in a country where hepatitis B is common. Ask your health care provider which countries are considered high risk.  Your parents were born in a high-risk country, and you have not been immunized against hepatitis B (hepatitis B vaccine).  You have HIV or AIDS.  You use needles to inject street drugs.  You live with someone who has hepatitis B.  You have had sex with someone who has hepatitis B.  You get hemodialysis treatment.  You take certain medicines for conditions, including cancer, organ transplantation, and autoimmune conditions. Hepatitis C  Blood testing is recommended for:  Everyone born from 1945 through 1965.  Anyone with known risk factors for hepatitis C. Sexually transmitted infections (STIs)  You should be screened for sexually transmitted infections (STIs) including gonorrhea and chlamydia if:  You are sexually active and are younger than 41 years of age.  You are older than 41 years of age and your health care provider tells you that you are at risk for this type of infection.  Your sexual activity has changed since you were last screened and you are at an increased risk for chlamydia or gonorrhea. Ask your health care provider if you are at risk.  If you do not have HIV, but are at risk, it may be recommended that you take a prescription medicine daily to prevent HIV infection. This is called pre-exposure prophylaxis (PrEP). You are considered at risk if:  You are  sexually active and do not regularly use condoms or know the HIV status of your partner(s).  You take drugs by injection.  You are sexually active with a partner who has HIV. Talk with your health care provider about whether you are at high risk of being infected with HIV. If you choose to begin PrEP, you should first be tested for HIV. You should then be tested every 3 months for as long as you   are taking PrEP.  PREGNANCY   If you are premenopausal and you may become pregnant, ask your health care provider about preconception counseling.  If you may become pregnant, take 400 to 800 micrograms (mcg) of folic acid every day.  If you want to prevent pregnancy, talk to your health care provider about birth control (contraception). OSTEOPOROSIS AND MENOPAUSE   Osteoporosis is a disease in which the bones lose minerals and strength with aging. This can result in serious bone fractures. Your risk for osteoporosis can be identified using a bone density scan.  If you are 65 years of age or older, or if you are at risk for osteoporosis and fractures, ask your health care provider if you should be screened.  Ask your health care provider whether you should take a calcium or vitamin D supplement to lower your risk for osteoporosis.  Menopause may have certain physical symptoms and risks.  Hormone replacement therapy may reduce some of these symptoms and risks. Talk to your health care provider about whether hormone replacement therapy is right for you.  HOME CARE INSTRUCTIONS   Schedule regular health, dental, and eye exams.  Stay current with your immunizations.   Do not use any tobacco products including cigarettes, chewing tobacco, or electronic cigarettes.  If you are pregnant, do not drink alcohol.  If you are breastfeeding, limit how much and how often you drink alcohol.  Limit alcohol intake to no more than 1 drink per day for nonpregnant women. One drink equals 12 ounces of beer, 5  ounces of wine, or 1 ounces of hard liquor.  Do not use street drugs.  Do not share needles.  Ask your health care provider for help if you need support or information about quitting drugs.  Tell your health care provider if you often feel depressed.  Tell your health care provider if you have ever been abused or do not feel safe at home.   This information is not intended to replace advice given to you by your health care provider. Make sure you discuss any questions you have with your health care provider.   Document Released: 11/06/2010 Document Revised: 05/14/2014 Document Reviewed: 03/25/2013 Elsevier Interactive Patient Education 2016 [employer]  

## 2015-09-03 ENCOUNTER — Other Ambulatory Visit: Payer: Self-pay | Admitting: Family Medicine

## 2015-09-03 MED ORDER — LEVOTHYROXINE SODIUM 112 MCG PO TABS: ORAL_TABLET | ORAL | Status: DC

## 2015-11-23 LAB — HM PAP SMEAR

## 2015-11-27 ENCOUNTER — Encounter: Payer: Self-pay | Admitting: Family Medicine

## 2016-07-25 ENCOUNTER — Ambulatory Visit (INDEPENDENT_AMBULATORY_CARE_PROVIDER_SITE_OTHER): Payer: PRIVATE HEALTH INSURANCE | Admitting: Family Medicine

## 2016-07-25 ENCOUNTER — Encounter: Payer: Self-pay | Admitting: Family Medicine

## 2016-07-25 VITALS — BP 108/78 | HR 108 | Temp 98.4°F | Resp 18 | Wt 197.0 lb

## 2016-07-25 DIAGNOSIS — J4521 Mild intermittent asthma with (acute) exacerbation: Secondary | ICD-10-CM | POA: Diagnosis not present

## 2016-07-25 MED ORDER — HYDROCODONE-HOMATROPINE 5-1.5 MG/5ML PO SYRP: 5.0000 mL | ORAL_SOLUTION | Freq: Three times a day (TID) | ORAL | 0 refills | Status: DC | PRN

## 2016-07-25 MED ORDER — AZITHROMYCIN 250 MG PO TABS: ORAL_TABLET | ORAL | 0 refills | Status: DC

## 2016-07-25 MED ORDER — PREDNISONE 20 MG PO TABS: 20.0000 mg | ORAL_TABLET | Freq: Every day | ORAL | 0 refills | Status: DC

## 2016-07-25 NOTE — Progress Notes (Signed)
Subjective:    Patient ID: Tabitha MattesLaurie J Spence, female    DOB: 03/20/1976, 41 y.o.   MRN: [id number]  HPI This is a 41 yo female who presents today with fatigue, dry cough, occasionally productive of green sputum x 3 days, fever x 2 days, high 102.2. No generalized aches. Some chest and back pian with coughing. Taking alka seltzer plus with some relief. Some wheeze, good relief with albuterol inhaler a couple of times a day. Uses symbicort daily. No ear pain, some stuffiness, no sore throat, little nasal drainage, feels congested (year round). Takes allegra daily, for about a year, feels like it works well. Works in a daycare center.  Intentional weight loss with diet and exercise.   Past Medical History:  Diagnosis Date  . Asthma   . Frequent headaches   . History of chicken pox   . Hypothyroidism   . Obesity 03/06/2013  . Perennial allergic rhinitis    Past Surgical History:  Procedure Laterality Date  . APPENDECTOMY  1999   Family History  Problem Relation Age of Onset  . Cancer Paternal Grandmother     breast  . Cancer Maternal Grandfather     blood  . Hypertension Mother   . Arthritis Maternal Grandmother   . CAD Neg Hx   . Stroke Neg Hx   . Diabetes Neg Hx    Social History  Substance Use Topics  . Smoking status: Former Smoker    Packs/day: 1.00    Years: 8.00    Types: Cigarettes    Quit date: 05/08/1999  . Smokeless tobacco: Never Used  . Alcohol use No      Review of Systems Per HPI    Objective:   Physical Exam  Constitutional: She is oriented to person, place, and time. She appears well-developed and well-nourished. She appears ill. No distress.  HENT:  Head: Normocephalic and atraumatic.  Right Ear: Tympanic membrane, external ear and ear canal normal.  Left Ear: Tympanic membrane, external ear and ear canal normal.  Nose: Mucosal edema present.  Mouth/Throat: Posterior oropharyngeal erythema present. No oropharyngeal exudate or posterior oropharyngeal  edema.  Eyes: Conjunctivae are normal.  Neck: Normal range of motion. Neck supple.  Cardiovascular: Normal rate, regular rhythm and normal heart sounds.   Pulmonary/Chest: Effort normal. No respiratory distress. She has wheezes (faint,  posterior, expiratory).  Frequent, tight sounding cough.   Lymphadenopathy:    She has no cervical adenopathy.  Neurological: She is alert and oriented to person, place, and time.  Skin: Skin is warm and dry. She is not diaphoretic.  Psychiatric: She has a normal mood and affect. Her behavior is normal. Judgment and thought content normal.  Vitals reviewed.     BP 108/78 (BP Location: Right Arm, Patient Position: Sitting, Cuff Size: Normal)   Pulse (!) 108   Temp 98.4 F (36.9 C) (Oral)   Resp 18   Wt 197 lb (89.4 kg)   LMP 07/04/2016   SpO2 97%   BMI 31.80 kg/m  Wt Readings from Last 3 Encounters:  07/25/16 197 lb (89.4 kg)  09/02/15 222 lb 4 oz (100.8 kg)  07/27/14 228 lb 1.9 oz (103.5 kg)      Assessment & Plan:  1. Mild intermittent asthmatic bronchitis with acute exacerbation - Provided written and verbal information regarding diagnosis and treatment. - RTC precautions reviewed - azithromycin (ZITHROMAX) 250 MG tablet; Take 2 tabs PO x 1 dose, then 1 tab PO QD x 4 days  Dispense: 6 tablet; Refill: 0 - predniSONE (DELTASONE) 20 MG tablet; Take 1 tablet (20 mg total) by mouth daily with breakfast.  Dispense: 5 tablet; Refill: 0 - HYDROcodone-homatropine (HYCODAN) 5-1.5 MG/5ML syrup; Take 5 mLs by mouth every 8 (eight) hours as needed for cough.  Dispense: 120 mL; Refill: 0 Olean Ree, FNP-BC  [facility], [facility]  07/25/2016 12:15 PM

## 2016-07-25 NOTE — Patient Instructions (Signed)
For nasal congestion you can use Afrin nasal spray for 3 days max, Sudafed, saline nasal spray (generic is fine for all). Continue allegra, albuterol, symbicort Drink enough fluids to make your urine light yellow. For fever/chill/muscle aches you can take over the counter acetaminophen or ibuprofen.  Please come back in if you are not better in 5-7 days or if you develop wheezing, shortness of breath or persistent vomiting.

## 2016-07-25 NOTE — Progress Notes (Signed)
Pre visit review using our clinic review tool, if applicable. No additional management support is needed unless otherwise documented below in the visit note. 

## 2016-08-17 ENCOUNTER — Other Ambulatory Visit: Payer: Self-pay | Admitting: Family Medicine

## 2016-09-11 ENCOUNTER — Ambulatory Visit (INDEPENDENT_AMBULATORY_CARE_PROVIDER_SITE_OTHER): Payer: PRIVATE HEALTH INSURANCE | Admitting: Family Medicine

## 2016-09-11 ENCOUNTER — Encounter: Payer: Self-pay | Admitting: Family Medicine

## 2016-09-11 VITALS — BP 116/68 | HR 69 | Temp 98.1°F | Ht 65.75 in | Wt 201.5 lb

## 2016-09-11 DIAGNOSIS — E039 Hypothyroidism, unspecified: Secondary | ICD-10-CM

## 2016-09-11 DIAGNOSIS — E669 Obesity, unspecified: Secondary | ICD-10-CM | POA: Diagnosis not present

## 2016-09-11 DIAGNOSIS — J4520 Mild intermittent asthma, uncomplicated: Secondary | ICD-10-CM | POA: Diagnosis not present

## 2016-09-11 DIAGNOSIS — Z0000 Encounter for general adult medical examination without abnormal findings: Secondary | ICD-10-CM

## 2016-09-11 DIAGNOSIS — J3089 Other allergic rhinitis: Secondary | ICD-10-CM

## 2016-09-11 LAB — BASIC METABOLIC PANEL
BUN: 15 mg/dL (ref 6–23)
CALCIUM: 9.8 mg/dL (ref 8.4–10.5)
CO2: 29 mEq/L (ref 19–32)
CREATININE: 0.82 mg/dL (ref 0.40–1.20)
Chloride: 105 mEq/L (ref 96–112)
GFR: 81.29 mL/min (ref >60.00)
Glucose, Bld: 99 mg/dL (ref 70–99)
Potassium: 4.9 mEq/L (ref 3.5–5.1)
SODIUM: 139 meq/L (ref 135–145)

## 2016-09-11 LAB — TSH: TSH: 0.76 u[IU]/mL (ref 0.35–4.50)

## 2016-09-11 MED ORDER — LEVOTHYROXINE SODIUM 112 MCG PO TABS: 112.0000 ug | ORAL_TABLET | Freq: Every day | ORAL | 3 refills | Status: DC

## 2016-09-11 NOTE — Assessment & Plan Note (Signed)
Preventative protocols reviewed and updated unless pt declined. Discussed healthy diet and lifestyle.  

## 2016-09-11 NOTE — Assessment & Plan Note (Signed)
Continue antihistamine 

## 2016-09-11 NOTE — Assessment & Plan Note (Signed)
Update labs, refilled meds.

## 2016-09-11 NOTE — Assessment & Plan Note (Addendum)
Discussed healthy diet and lifestyle changes to affect sustainable weight loss  

## 2016-09-11 NOTE — Patient Instructions (Signed)
You are doing well today. Labs today. Medicines refilled Return as needed or in 1 year for next physical.  Health Maintenance, Female Adopting a healthy lifestyle and getting preventive care can go a long way to promote health and wellness. Talk with your health care provider about what schedule of regular examinations is right for you. This is a good chance for you to check in with your provider about disease prevention and staying healthy. In between checkups, there are plenty of things you can do on your own. Experts have done a lot of research about which lifestyle changes and preventive measures are most likely to keep you healthy. Ask your health care provider for more information. Weight and diet Eat a healthy diet  Be sure to include plenty of vegetables, fruits, low-fat dairy products, and lean protein.  Do not eat a lot of foods high in solid fats, added sugars, or salt.  Get regular exercise. This is one of the most important things you can do for your health.  Most adults should exercise for at least 150 minutes each week. The exercise should increase your heart rate and make you sweat (moderate-intensity exercise).  Most adults should also do strengthening exercises at least twice a week. This is in addition to the moderate-intensity exercise. Maintain a healthy weight  Body mass index (BMI) is a measurement that can be used to identify possible weight problems. It estimates body fat based on height and weight. Your health care provider can help determine your BMI and help you achieve or maintain a healthy weight.  For females 68 years of age and older:  A BMI below 18.5 is considered underweight.  A BMI of 18.5 to 24.9 is normal.  A BMI of 25 to 29.9 is considered overweight.  A BMI of 30 and above is considered obese. Watch levels of cholesterol and blood lipids  You should start having your blood tested for lipids and cholesterol at 42 years of age, then have this  test every 5 years.  You may need to have your cholesterol levels checked more often if:  Your lipid or cholesterol levels are high.  You are older than 42 years of age.  You are at high risk for heart disease. Cancer screening Lung Cancer  Lung cancer screening is recommended for adults 37-36 years old who are at high risk for lung cancer because of a history of smoking.  A yearly low-dose CT scan of the lungs is recommended for people who:  Currently smoke.  Have quit within the past 15 years.  Have at least a 30-pack-year history of smoking. A pack year is smoking an average of one pack of cigarettes a day for 1 year.  Yearly screening should continue until it has been 15 years since you quit.  Yearly screening should stop if you develop a health problem that would prevent you from having lung cancer treatment. Breast Cancer  Practice breast self-awareness. This means understanding how your breasts normally appear and feel.  It also means doing regular breast self-exams. Let your health care provider know about any changes, no matter how small.  If you are in your 20s or 30s, you should have a clinical breast exam (CBE) by a health care provider every 1-3 years as part of a regular health exam.  If you are 55 or older, have a CBE every year. Also consider having a breast X-ray (mammogram) every year.  If you have a family history of breast cancer,  talk to your health care provider about genetic screening.  If you are at high risk for breast cancer, talk to your health care provider about having an MRI and a mammogram every year.  Breast cancer gene (BRCA) assessment is recommended for women who have family members with BRCA-related cancers. BRCA-related cancers include:  Breast.  Ovarian.  Tubal.  Peritoneal cancers.  Results of the assessment will determine the need for genetic counseling and BRCA1 and BRCA2 testing. Cervical Cancer  Your health care provider  may recommend that you be screened regularly for cancer of the pelvic organs (ovaries, uterus, and vagina). This screening involves a pelvic examination, including checking for microscopic changes to the surface of your cervix (Pap test). You may be encouraged to have this screening done every 3 years, beginning at age 29.  For women ages 9-65, health care providers may recommend pelvic exams and Pap testing every 3 years, or they may recommend the Pap and pelvic exam, combined with testing for human papilloma virus (HPV), every 5 years. Some types of HPV increase your risk of cervical cancer. Testing for HPV may also be done on women of any age with unclear Pap test results.  Other health care providers may not recommend any screening for nonpregnant women who are considered low risk for pelvic cancer and who do not have symptoms. Ask your health care provider if a screening pelvic exam is right for you.  If you have had past treatment for cervical cancer or a condition that could lead to cancer, you need Pap tests and screening for cancer for at least 20 years after your treatment. If Pap tests have been discontinued, your risk factors (such as having a new sexual partner) need to be reassessed to determine if screening should resume. Some women have medical problems that increase the chance of getting cervical cancer. In these cases, your health care provider may recommend more frequent screening and Pap tests. Colorectal Cancer  This type of cancer can be detected and often prevented.  Routine colorectal cancer screening usually begins at 42 years of age and continues through 42 years of age.  Your health care provider may recommend screening at an earlier age if you have risk factors for colon cancer.  Your health care provider may also recommend using home test kits to check for hidden blood in the stool.  A small camera at the end of a tube can be used to examine your colon directly  (sigmoidoscopy or colonoscopy). This is done to check for the earliest forms of colorectal cancer.  Routine screening usually begins at age 70.  Direct examination of the colon should be repeated every 5-10 years through 42 years of age. However, you may need to be screened more often if early forms of precancerous polyps or small growths are found. Skin Cancer  Check your skin from head to toe regularly.  Tell your health care provider about any new moles or changes in moles, especially if there is a change in a mole's shape or color.  Also tell your health care provider if you have a mole that is larger than the size of a pencil eraser.  Always use sunscreen. Apply sunscreen liberally and repeatedly throughout the day.  Protect yourself by wearing long sleeves, pants, a wide-brimmed hat, and sunglasses whenever you are outside. Heart disease, diabetes, and high blood pressure  High blood pressure causes heart disease and increases the risk of stroke. High blood pressure is more likely to  develop in:  People who have blood pressure in the high end of the normal range (130-139/85-89 mm Hg).  People who are overweight or obese.  People who are African American.  If you are 42-77 years of age, have your blood pressure checked every 3-5 years. If you are 27 years of age or older, have your blood pressure checked every year. You should have your blood pressure measured twice-once when you are at a hospital or clinic, and once when you are not at a hospital or clinic. Record the average of the two measurements. To check your blood pressure when you are not at a hospital or clinic, you can use:  An automated blood pressure machine at a pharmacy.  A home blood pressure monitor.  If you are between 49 years and 34 years old, ask your health care provider if you should take aspirin to prevent strokes.  Have regular diabetes screenings. This involves taking a blood sample to check your  fasting blood sugar level.  If you are at a normal weight and have a low risk for diabetes, have this test once every three years after 42 years of age.  If you are overweight and have a high risk for diabetes, consider being tested at a younger age or more often. Preventing infection Hepatitis B  If you have a higher risk for hepatitis B, you should be screened for this virus. You are considered at high risk for hepatitis B if:  You were born in a country where hepatitis B is common. Ask your health care provider which countries are considered high risk.  Your parents were born in a high-risk country, and you have not been immunized against hepatitis B (hepatitis B vaccine).  You have HIV or AIDS.  You use needles to inject street drugs.  You live with someone who has hepatitis B.  You have had sex with someone who has hepatitis B.  You get hemodialysis treatment.  You take certain medicines for conditions, including cancer, organ transplantation, and autoimmune conditions. Hepatitis C  Blood testing is recommended for:  Everyone born from [birth year] through 1966.  Anyone with known risk factors for hepatitis C. Sexually transmitted infections (STIs)  You should be screened for sexually transmitted infections (STIs) including gonorrhea and chlamydia if:  You are sexually active and are younger than 42 years of age.  You are older than 42 years of age and your health care provider tells you that you are at risk for this type of infection.  Your sexual activity has changed since you were last screened and you are at an increased risk for chlamydia or gonorrhea. Ask your health care provider if you are at risk.  If you do not have HIV, but are at risk, it may be recommended that you take a prescription medicine daily to prevent HIV infection. This is called pre-exposure prophylaxis (PrEP). You are considered at risk if:  You are sexually active and do not regularly use condoms or  know the HIV status of your partner(s).  You take drugs by injection.  You are sexually active with a partner who has HIV. Talk with your health care provider about whether you are at high risk of being infected with HIV. If you choose to begin PrEP, you should first be tested for HIV. You should then be tested every 3 months for as long as you are taking PrEP. Pregnancy  If you are premenopausal and you may become pregnant, ask your health  care provider about preconception counseling.  If you may become pregnant, take 400 to 800 micrograms (mcg) of folic acid every day.  If you want to prevent pregnancy, talk to your health care provider about birth control (contraception). Osteoporosis and menopause  Osteoporosis is a disease in which the bones lose minerals and strength with aging. This can result in serious bone fractures. Your risk for osteoporosis can be identified using a bone density scan.  If you are 68 years of age or older, or if you are at risk for osteoporosis and fractures, ask your health care provider if you should be screened.  Ask your health care provider whether you should take a calcium or vitamin D supplement to lower your risk for osteoporosis.  Menopause may have certain physical symptoms and risks.  Hormone replacement therapy may reduce some of these symptoms and risks. Talk to your health care provider about whether hormone replacement therapy is right for you. Follow these instructions at home:  Schedule regular health, dental, and eye exams.  Stay current with your immunizations.  Do not use any tobacco products including cigarettes, chewing tobacco, or electronic cigarettes.  If you are pregnant, do not drink alcohol.  If you are breastfeeding, limit how much and how often you drink alcohol.  Limit alcohol intake to no more than 1 drink per day for nonpregnant women. One drink equals 12 ounces of beer, 5 ounces of wine, or 1 ounces of hard  liquor.  Do not use street drugs.  Do not share needles.  Ask your health care provider for help if you need support or information about quitting drugs.  Tell your health care provider if you often feel depressed.  Tell your health care provider if you have ever been abused or do not feel safe at home. This information is not intended to replace advice given to you by your health care provider. Make sure you discuss any questions you have with your health care provider. Document Released: 11/06/2010 Document Revised: 09/29/2015 Document Reviewed: 01/25/2015 Elsevier Interactive Patient Education  2017 [employer].

## 2016-09-11 NOTE — Progress Notes (Signed)
Pre visit review using our clinic review tool, if applicable. No additional management support is needed unless otherwise documented below in the visit note. ann 

## 2016-09-11 NOTE — Progress Notes (Signed)
BP 116/68   Pulse 69   Temp 98.1 F (36.7 C) (Oral)   Ht 5' 5.75" (1.67 m)   Wt 201 lb 8 oz (91.4 kg)   LMP 08/31/2016   SpO2 98%   BMI 32.77 kg/m    CC: CPE Subjective:    Patient ID: Tabitha MattesLaurie J Dickmann, female    DOB: 07/08/1975, 41 y.o.   MRN: [id number]  HPI: Tabitha Spence is a 41 y.o. female presenting on 09/11/2016 for Annual Exam   Asthma well controlled with symbicort 80 1 puffs BID. Triggers are strong smells and some allergies. Rare albuterol use.   Hypothyroidism - stable on current regimen.   Preventative: Well woman [facility] Henderson [facility], last 11/2015. Pap smear WNL. Mammogram 11/2015 normal per patient Flu shot intermittently Tdap 2014 Seat belt use discussed Sunscreen use discussed. No changing moles. h/o BCC on left nose.  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 Interim medical history since our last visit reviewed. Allergies and medications reviewed and updated. Outpatient Medications Prior to Visit  Medication Sig Dispense Refill  . albuterol (PROAIR HFA) 108 (90 BASE) MCG/ACT inhaler Inhale 1-2 puffs into the lungs every 6 (six) hours as needed. 1 Inhaler 1  . budesonide-formoterol (SYMBICORT) 80-4.5 MCG/ACT inhaler Inhale 1 puff into the lungs 2 (two) times daily. 10.2 Inhaler 11  . fexofenadine (ALLEGRA) 180 MG tablet Take 180 mg by mouth daily.    Marland Kitchen. levothyroxine (SYNTHROID, LEVOTHROID) 112 MCG tablet TAKE 1 TABLET BY MOUTH EVERY DAY 30 tablet 0  . azithromycin (ZITHROMAX) 250 MG tablet Take 2 tabs PO x 1 dose, then 1 tab PO QD x 4 days 6 tablet 0  . HYDROcodone-homatropine (HYCODAN) 5-1.5 MG/5ML syrup Take  5 mLs by mouth every 8 (eight) hours as needed for cough. 120 mL 0  . predniSONE (DELTASONE) 20 MG tablet Take 1 tablet (20 mg total) by mouth daily with breakfast. 5 tablet 0   No facility-administered medications prior to visit.      Per HPI unless specifically indicated in ROS section below Review of Systems  Constitutional: Negative for activity change, appetite change, chills, fatigue, fever and unexpected weight change.  HENT: Negative for hearing loss.   Eyes: Negative for visual disturbance.  Respiratory: Negative for cough, chest tightness, shortness of breath and wheezing.   Cardiovascular: Negative for chest pain, palpitations and leg swelling.  Gastrointestinal: Negative for abdominal distention, abdominal pain, blood in stool, constipation, diarrhea, nausea and vomiting.  Genitourinary: Negative for difficulty urinating and hematuria.  Musculoskeletal: Negative for arthralgias, myalgias and neck pain.  Skin: Negative for rash.  Neurological: Negative for dizziness, seizures, syncope and headaches.  Hematological: Negative for adenopathy. Does not bruise/bleed easily.  Psychiatric/Behavioral: Negative for dysphoric mood. The patient is not nervous/anxious.        Objective:    BP 116/68   Pulse 69   Temp 98.1 F (36.7 C) (Oral)   Ht 5' 5.75" (1.67 m)   Wt 201 lb 8 oz (91.4 kg)   LMP 08/31/2016   SpO2 98%   BMI 32.77 kg/m   Wt Readings from Last 3 Encounters:  09/11/16 201 lb  8 oz (91.4 kg)  07/25/16 197 lb (89.4 kg)  09/02/15 222 lb 4 oz (100.8 kg)    Physical Exam  Constitutional: She is oriented to person, place, and time. She appears well-developed and well-nourished. No distress.  HENT:  Head: Normocephalic and atraumatic.  Right Ear: Hearing, tympanic membrane, external ear and ear canal normal.  Left Ear: Hearing, tympanic membrane, external ear and ear canal normal.  Nose: Nose normal.  Mouth/Throat: Uvula is midline, oropharynx is clear and moist and  mucous membranes are normal. No oropharyngeal exudate, posterior oropharyngeal edema or posterior oropharyngeal erythema.  Eyes: Conjunctivae and EOM are normal. Pupils are equal, round, and reactive to light. No scleral icterus.  Neck: Normal range of motion. Neck supple. No thyromegaly present.  Cardiovascular: Normal rate, regular rhythm, normal heart sounds and intact distal pulses.   No murmur heard. Pulses:      Radial pulses are 2+ on the right side, and 2+ on the left side.  Pulmonary/Chest: Effort normal and breath sounds normal. No respiratory distress. She has no wheezes. She has no rales.  Abdominal: Soft. Bowel sounds are normal. She exhibits no distension and no mass. There is no tenderness. There is no rebound and no guarding.  Musculoskeletal: Normal range of motion. She exhibits no edema.  Lymphadenopathy:    She has no cervical adenopathy.  Neurological: She is alert and oriented to person, place, and time.  CN grossly intact, station and gait intact  Skin: Skin is warm and dry. No rash noted.  Psychiatric: She has a normal mood and affect. Her behavior is normal. Judgment and thought content normal.  Nursing note and vitals reviewed.  Results for orders placed or performed in visit on 11/27/15  HM PAP SMEAR  Result Value Ref Range   HM Pap smear GYN       Assessment & Plan:   Problem List Items Addressed This Visit    Asthma    Chronic, stable. Continue symbicort. [employer] patient assistance program application form filled out.       Health maintenance examination - Primary    Preventative protocols reviewed and updated unless pt declined. Discussed healthy diet and lifestyle.       Hypothyroidism    Update labs, refilled meds.       Relevant Medications   levothyroxine (SYNTHROID, LEVOTHROID) 112 MCG tablet   Other Relevant Orders   Basic metabolic panel   TSH   Obesity, Class I, BMI 30.0-34.9 (see actual BMI)    Discussed healthy diet and  lifestyle changes to affect sustainable weight loss      Perennial allergic rhinitis    Continue antihistamine          Follow up plan: Return in about 1 year (around 09/11/2017) for annual exam, prior fasting for blood work.  Eustaquio Boyden, MD

## 2016-09-11 NOTE — Assessment & Plan Note (Addendum)
Chronic, stable. Continue symbicort. [employer] patient assistance program application form filled out.

## 2016-12-05 ENCOUNTER — Encounter: Payer: Self-pay | Admitting: Family Medicine

## 2016-12-07 MED ORDER — BUDESONIDE-FORMOTEROL FUMARATE 80-4.5 MCG/ACT IN AERO: 1.0000 | INHALATION_SPRAY | Freq: Two times a day (BID) | RESPIRATORY_TRACT | 11 refills | Status: DC

## 2016-12-07 NOTE — Telephone Encounter (Signed)
[employer] patient assistance forms and prescription were faxed today

## 2016-12-07 NOTE — Telephone Encounter (Signed)
plz fax to med assistance program. placed in CMA box.

## 2016-12-12 LAB — HM MAMMOGRAPHY: HM MAMMO: NORMAL (ref 0–4)

## 2016-12-12 LAB — HM PAP SMEAR: HM Pap smear: NORMAL

## 2016-12-14 LAB — HM PAP SMEAR: HM Pap smear: NEGATIVE

## 2016-12-14 LAB — RESULTS CONSOLE HPV: CHL HPV: NEGATIVE

## 2017-02-20 ENCOUNTER — Ambulatory Visit (INDEPENDENT_AMBULATORY_CARE_PROVIDER_SITE_OTHER): Payer: PRIVATE HEALTH INSURANCE

## 2017-02-20 DIAGNOSIS — Z23 Encounter for immunization: Secondary | ICD-10-CM

## 2017-10-02 ENCOUNTER — Telehealth: Payer: Self-pay | Admitting: Family Medicine

## 2017-10-02 ENCOUNTER — Other Ambulatory Visit: Payer: Self-pay | Admitting: Emergency Medicine

## 2017-10-02 MED ORDER — LEVOTHYROXINE SODIUM 112 MCG PO TABS: 112.0000 ug | ORAL_TABLET | Freq: Every day | ORAL | 0 refills | Status: DC

## 2017-10-02 NOTE — Telephone Encounter (Signed)
I spoke with pt to r/s her appointment 6/3.  Pt stated she needed a refill on her thyroid rx.  She will be running out before 6/19.   [facility] [address]

## 2017-10-02 NOTE — Telephone Encounter (Signed)
30 day supply sent to pharmacy  Last TSH was 09/2016  Appointment scheduled for 10/23/17

## 2017-10-02 NOTE — Telephone Encounter (Signed)
Noted  

## 2017-10-07 ENCOUNTER — Encounter: Payer: PRIVATE HEALTH INSURANCE | Admitting: Family Medicine

## 2017-10-08 ENCOUNTER — Other Ambulatory Visit: Payer: Self-pay | Admitting: Family Medicine

## 2017-10-23 ENCOUNTER — Ambulatory Visit (INDEPENDENT_AMBULATORY_CARE_PROVIDER_SITE_OTHER): Payer: PRIVATE HEALTH INSURANCE | Admitting: Family Medicine

## 2017-10-23 ENCOUNTER — Encounter: Payer: Self-pay | Admitting: Family Medicine

## 2017-10-23 VITALS — BP 116/80 | HR 78 | Temp 98.6°F | Ht 66.0 in | Wt 215.0 lb

## 2017-10-23 DIAGNOSIS — M238X9 Other internal derangements of unspecified knee: Secondary | ICD-10-CM

## 2017-10-23 DIAGNOSIS — Z0000 Encounter for general adult medical examination without abnormal findings: Secondary | ICD-10-CM | POA: Diagnosis not present

## 2017-10-23 DIAGNOSIS — E039 Hypothyroidism, unspecified: Secondary | ICD-10-CM

## 2017-10-23 DIAGNOSIS — E669 Obesity, unspecified: Secondary | ICD-10-CM | POA: Diagnosis not present

## 2017-10-23 DIAGNOSIS — J4520 Mild intermittent asthma, uncomplicated: Secondary | ICD-10-CM

## 2017-10-23 MED ORDER — ALBUTEROL SULFATE HFA 108 (90 BASE) MCG/ACT IN AERS: 1.0000 | INHALATION_SPRAY | Freq: Four times a day (QID) | RESPIRATORY_TRACT | 3 refills | Status: DC | PRN

## 2017-10-23 MED ORDER — LEVOTHYROXINE SODIUM 112 MCG PO TABS: 112.0000 ug | ORAL_TABLET | Freq: Every day | ORAL | 3 refills | Status: DC

## 2017-10-23 NOTE — Patient Instructions (Signed)
Labs today.  Trial glucosamine for crepitus of knees.  You are doing well today. Return as needed or in 1 year for next physical.  Health Maintenance, Female Adopting a healthy lifestyle and getting preventive care can go a long way to promote health and wellness. Talk with your health care provider about what schedule of regular examinations is right for you. This is a good chance for you to check in with your provider about disease prevention and staying healthy. In between checkups, there are plenty of things you can do on your own. Experts have done a lot of research about which lifestyle changes and preventive measures are most likely to keep you healthy. Ask your health care provider for more information. Weight and diet Eat a healthy diet  Be sure to include plenty of vegetables, fruits, low-fat dairy products, and lean protein.  Do not eat a lot of foods high in solid fats, added sugars, or salt.  Get regular exercise. This is one of the most important things you can do for your health. ? Most adults should exercise for at least 150 minutes each week. The exercise should increase your heart rate and make you sweat (moderate-intensity exercise). ? Most adults should also do strengthening exercises at least twice a week. This is in addition to the moderate-intensity exercise.  Maintain a healthy weight  Body mass index (BMI) is a measurement that can be used to identify possible weight problems. It estimates body fat based on height and weight. Your health care provider can help determine your BMI and help you achieve or maintain a healthy weight.  For females 78 years of age and older: ? A BMI below 18.5 is considered underweight. ? A BMI of 18.5 to 24.9 is normal. ? A BMI of 25 to 29.9 is considered overweight. ? A BMI of 30 and above is considered obese.  Watch levels of cholesterol and blood lipids  You should start having your blood tested for lipids and cholesterol at 43  years of age, then have this test every 5 years.  You may need to have your cholesterol levels checked more often if: ? Your lipid or cholesterol levels are high. ? You are older than 43 years of age. ? You are at high risk for heart disease.  Cancer screening Lung Cancer  Lung cancer screening is recommended for adults 63-86 years old who are at high risk for lung cancer because of a history of smoking.  A yearly low-dose CT scan of the lungs is recommended for people who: ? Currently smoke. ? Have quit within the past 15 years. ? Have at least a 30-pack-year history of smoking. A pack year is smoking an average of one pack of cigarettes a day for 1 year.  Yearly screening should continue until it has been 15 years since you quit.  Yearly screening should stop if you develop a health problem that would prevent you from having lung cancer treatment.  Breast Cancer  Practice breast self-awareness. This means understanding how your breasts normally appear and feel.  It also means doing regular breast self-exams. Let your health care provider know about any changes, no matter how small.  If you are in your 20s or 30s, you should have a clinical breast exam (CBE) by a health care provider every 1-3 years as part of a regular health exam.  If you are 61 or older, have a CBE every year. Also consider having a breast X-ray (mammogram) every year.  If you have a family history of breast cancer, talk to your health care provider about genetic screening.  If you are at high risk for breast cancer, talk to your health care provider about having an MRI and a mammogram every year.  Breast cancer gene (BRCA) assessment is recommended for women who have family members with BRCA-related cancers. BRCA-related cancers include: ? Breast. ? Ovarian. ? Tubal. ? Peritoneal cancers.  Results of the assessment will determine the need for genetic counseling and BRCA1 and BRCA2 testing.  Cervical  Cancer Your health care provider may recommend that you be screened regularly for cancer of the pelvic organs (ovaries, uterus, and vagina). This screening involves a pelvic examination, including checking for microscopic changes to the surface of your cervix (Pap test). You may be encouraged to have this screening done every 3 years, beginning at age 28.  For women ages 42-65, health care providers may recommend pelvic exams and Pap testing every 3 years, or they may recommend the Pap and pelvic exam, combined with testing for human papilloma virus (HPV), every 5 years. Some types of HPV increase your risk of cervical cancer. Testing for HPV may also be done on women of any age with unclear Pap test results.  Other health care providers may not recommend any screening for nonpregnant women who are considered low risk for pelvic cancer and who do not have symptoms. Ask your health care provider if a screening pelvic exam is right for you.  If you have had past treatment for cervical cancer or a condition that could lead to cancer, you need Pap tests and screening for cancer for at least 20 years after your treatment. If Pap tests have been discontinued, your risk factors (such as having a new sexual partner) need to be reassessed to determine if screening should resume. Some women have medical problems that increase the chance of getting cervical cancer. In these cases, your health care provider may recommend more frequent screening and Pap tests.  Colorectal Cancer  This type of cancer can be detected and often prevented.  Routine colorectal cancer screening usually begins at 43 years of age and continues through 43 years of age.  Your health care provider may recommend screening at an earlier age if you have risk factors for colon cancer.  Your health care provider may also recommend using home test kits to check for hidden blood in the stool.  A small camera at the end of a tube can be used to  examine your colon directly (sigmoidoscopy or colonoscopy). This is done to check for the earliest forms of colorectal cancer.  Routine screening usually begins at age 21.  Direct examination of the colon should be repeated every 5-10 years through 43 years of age. However, you may need to be screened more often if early forms of precancerous polyps or small growths are found.  Skin Cancer  Check your skin from head to toe regularly.  Tell your health care provider about any new moles or changes in moles, especially if there is a change in a mole's shape or color.  Also tell your health care provider if you have a mole that is larger than the size of a pencil eraser.  Always use sunscreen. Apply sunscreen liberally and repeatedly throughout the day.  Protect yourself by wearing long sleeves, pants, a wide-brimmed hat, and sunglasses whenever you are outside.  Heart disease, diabetes, and high blood pressure  High blood pressure causes heart disease and  increases the risk of stroke. High blood pressure is more likely to develop in: ? People who have blood pressure in the high end of the normal range (130-139/85-89 mm Hg). ? People who are overweight or obese. ? People who are African American.  If you are 73-54 years of age, have your blood pressure checked every 3-5 years. If you are 23 years of age or older, have your blood pressure checked every year. You should have your blood pressure measured twice-once when you are at a hospital or clinic, and once when you are not at a hospital or clinic. Record the average of the two measurements. To check your blood pressure when you are not at a hospital or clinic, you can use: ? An automated blood pressure machine at a pharmacy. ? A home blood pressure monitor.  If you are between 67 years and 20 years old, ask your health care provider if you should take aspirin to prevent strokes.  Have regular diabetes screenings. This involves taking a  blood sample to check your fasting blood sugar level. ? If you are at a normal weight and have a low risk for diabetes, have this test once every three years after 43 years of age. ? If you are overweight and have a high risk for diabetes, consider being tested at a younger age or more often. Preventing infection Hepatitis B  If you have a higher risk for hepatitis B, you should be screened for this virus. You are considered at high risk for hepatitis B if: ? You were born in a country where hepatitis B is common. Ask your health care provider which countries are considered high risk. ? Your parents were born in a high-risk country, and you have not been immunized against hepatitis B (hepatitis B vaccine). ? You have HIV or AIDS. ? You use needles to inject street drugs. ? You live with someone who has hepatitis B. ? You have had sex with someone who has hepatitis B. ? You get hemodialysis treatment. ? You take certain medicines for conditions, including cancer, organ transplantation, and autoimmune conditions.  Hepatitis C  Blood testing is recommended for: ? Everyone born from [birth year] through 1966. ? Anyone with known risk factors for hepatitis C.  Sexually transmitted infections (STIs)  You should be screened for sexually transmitted infections (STIs) including gonorrhea and chlamydia if: ? You are sexually active and are younger than 43 years of age. ? You are older than 43 years of age and your health care provider tells you that you are at risk for this type of infection. ? Your sexual activity has changed since you were last screened and you are at an increased risk for chlamydia or gonorrhea. Ask your health care provider if you are at risk.  If you do not have HIV, but are at risk, it may be recommended that you take a prescription medicine daily to prevent HIV infection. This is called pre-exposure prophylaxis (PrEP). You are considered at risk if: ? You are sexually active and  do not regularly use condoms or know the HIV status of your partner(s). ? You take drugs by injection. ? You are sexually active with a partner who has HIV.  Talk with your health care provider about whether you are at high risk of being infected with HIV. If you choose to begin PrEP, you should first be tested for HIV. You should then be tested every 3 months for as long as you are taking  PrEP. Pregnancy  If you are premenopausal and you may become pregnant, ask your health care provider about preconception counseling.  If you may become pregnant, take 400 to 800 micrograms (mcg) of folic acid every day.  If you want to prevent pregnancy, talk to your health care provider about birth control (contraception). Osteoporosis and menopause  Osteoporosis is a disease in which the bones lose minerals and strength with aging. This can result in serious bone fractures. Your risk for osteoporosis can be identified using a bone density scan.  If you are 45 years of age or older, or if you are at risk for osteoporosis and fractures, ask your health care provider if you should be screened.  Ask your health care provider whether you should take a calcium or vitamin D supplement to lower your risk for osteoporosis.  Menopause may have certain physical symptoms and risks.  Hormone replacement therapy may reduce some of these symptoms and risks. Talk to your health care provider about whether hormone replacement therapy is right for you. Follow these instructions at home:  Schedule regular health, dental, and eye exams.  Stay current with your immunizations.  Do not use any tobacco products including cigarettes, chewing tobacco, or electronic cigarettes.  If you are pregnant, do not drink alcohol.  If you are breastfeeding, limit how much and how often you drink alcohol.  Limit alcohol intake to no more than 1 drink per day for nonpregnant women. One drink equals 12 ounces of beer, 5 ounces of  wine, or 1 ounces of hard liquor.  Do not use street drugs.  Do not share needles.  Ask your health care provider for help if you need support or information about quitting drugs.  Tell your health care provider if you often feel depressed.  Tell your health care provider if you have ever been abused or do not feel safe at home. This information is not intended to replace advice given to you by your health care provider. Make sure you discuss any questions you have with your health care provider. Document Released: 11/06/2010 Document Revised: 09/29/2015 Document Reviewed: 01/25/2015 Elsevier Interactive Patient Education  [employer].

## 2017-10-23 NOTE — Assessment & Plan Note (Signed)
Chronic, update labs.  

## 2017-10-23 NOTE — Assessment & Plan Note (Signed)
Preventative protocols reviewed and updated unless pt declined. Discussed healthy diet and lifestyle.  

## 2017-10-23 NOTE — Assessment & Plan Note (Signed)
Encouraged healthy diet and lifestyle changes to affect sustainable weight loss.  

## 2017-10-23 NOTE — Assessment & Plan Note (Signed)
[employer] application form filled out for patient.  Chronic, stable on daily symbicort.

## 2017-10-23 NOTE — Progress Notes (Signed)
BP 116/80 (BP Location: Left Arm, Patient Position: Sitting, Cuff Size: Large)   Pulse 78   Temp 98.6 F (37 C) (Oral)   Ht 5\' 6"  (1.676 m)   Wt 215 lb (97.5 kg)   LMP 10/02/2017   SpO2 98%   BMI 34.70 kg/m    CC: CPE Subjective:    Patient ID: Tabitha Spence Suell, female    DOB: 02/09/1976, 42 y.o.   MRN: [id number]  HPI: Tabitha Spence Fragoso is a 42 y.o. female presenting on 10/23/2017 for Annual Exam (Has form to be completed)   Occasional occipital headaches described as pressure to back of head. Not migraines. ?barometric pressure related. Treats with aleve or excedrin. About once a month.  Marked knee crepitus without pain  Preventative: [facility] last [facility]/2016. Pap smear WNL. Mammogram 12/2016 normal per patient Flu shot yearly Tdap 2014 Seat belt use discussed Sunscreen use discussed. No changing moles. h/o BCC on left nose. Sees derm yearly.  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 Interim medical history since our last visit reviewed. Allergies and medications reviewed and updated. Outpatient Medications Prior to Visit  Medication Sig Dispense Refill  . budesonide-formoterol (SYMBICORT) 80-4.5 MCG/ACT inhaler Inhale 1 puff into the lungs 2 (two) times daily. 10.2 Inhaler 11  . levocetirizine (XYZAL ALLERGY 24HR) 5 MG tablet     . norgestimate-ethinyl estradiol (SPRINTEC 28) 0.25-35 MG-MCG tablet Take 1 tablet by mouth daily.    Marland Kitchen. albuterol (PROAIR HFA) 108 (90 BASE) MCG/ACT inhaler Inhale 1-2 puffs into the lungs every 6 (six) hours as needed. 1  Inhaler 1  . levothyroxine (SYNTHROID, LEVOTHROID) 112 MCG tablet TAKE 1 TABLET BY MOUTH EVERY DAY 90 tablet 0  . cetirizine (ZYRTEC) 10 MG tablet Take 10 mg by mouth daily.     No facility-administered medications prior to visit.      Per HPI unless specifically indicated in ROS section below Review of Systems  Constitutional: Negative for activity change, appetite change, chills, fatigue, fever and unexpected weight change.  HENT: Negative for hearing loss.   Eyes: Negative for visual disturbance.  Respiratory: Negative for cough, chest tightness, shortness of breath and wheezing.   Cardiovascular: Negative for chest pain, palpitations and leg swelling.  Gastrointestinal: Negative for abdominal distention, abdominal pain, blood in stool, constipation, diarrhea, nausea and vomiting.  Genitourinary: Negative for difficulty urinating and hematuria.  Musculoskeletal: Negative for arthralgias, myalgias and neck pain.  Skin: Negative for rash.  Neurological: Positive for headaches (see HPI). Negative for dizziness, seizures and syncope.  Hematological: Negative for adenopathy. Does not bruise/bleed easily.  Psychiatric/Behavioral: Negative for dysphoric mood. The patient is not nervous/anxious.        Objective:    BP 116/80 (BP Location: Left Arm, Patient Position: Sitting, Cuff Size: Large)   Pulse 78   Temp 98.6 F (37 C) (Oral)   Ht 5\' 6"  (1.676 m)   Wt 215 lb (97.5 kg)   LMP 10/02/2017   SpO2 98%   BMI 34.70 kg/m   Wt Readings from Last 3 Encounters:  10/23/17 215  lb (97.5 kg)  09/11/16 201 lb 8 oz (91.4 kg)  07/25/16 197 lb (89.4 kg)    Physical Exam  Constitutional: She is oriented to person, place, and time. She appears well-developed and well-nourished. No distress.  HENT:  Head: Normocephalic and atraumatic.  Right Ear: Hearing, tympanic membrane, external ear and ear canal normal.  Left Ear: Hearing, tympanic membrane, external ear and ear canal normal.  Nose:  Nose normal.  Mouth/Throat: Uvula is midline, oropharynx is clear and moist and mucous membranes are normal. No oropharyngeal exudate, posterior oropharyngeal edema or posterior oropharyngeal erythema.  Eyes: Pupils are equal, round, and reactive to light. Conjunctivae and EOM are normal. No scleral icterus.  Neck: Normal range of motion. Neck supple. No thyromegaly present.  Cardiovascular: Normal rate, regular rhythm, normal heart sounds and intact distal pulses.  No murmur heard. Pulses:      Radial pulses are 2+ on the right side, and 2+ on the left side.  Pulmonary/Chest: Effort normal and breath sounds normal. No respiratory distress. She has no wheezes. She has no rales.  Abdominal: Soft. Bowel sounds are normal. She exhibits no distension and no mass. There is no tenderness. There is no rebound and no guarding.  Musculoskeletal: Normal range of motion. She exhibits no edema.  Bilateral Knee exam: No deformity on inspection. No pain with palpation of knee landmarks. No effusion/swelling noted. FROM in flex/extension with MARKED crepitus. No popliteal fullness. Neg drawer test. Neg mcmurray test. No pain with valgus/varus stress. No PFgrind. No abnormal patellar mobility.   Lymphadenopathy:    She has no cervical adenopathy.  Neurological: She is alert and oriented to person, place, and time.  CN grossly intact, station and gait intact  Skin: Skin is warm and dry. No rash noted.  Psychiatric: She has a normal mood and affect. Her behavior is normal. Judgment and thought content normal.  Nursing note and vitals reviewed.  Results for orders placed or performed in visit on 10/23/17  HM MAMMOGRAPHY  Result Value Ref Range   HM Mammogram Self Reported Normal 0-4 Bi-Rad, Self Reported Normal  HM PAP SMEAR  Result Value Ref Range   HM Pap smear normal       Assessment & Plan:   Problem List Items Addressed This Visit    Obesity, Class I, BMI 30.0-34.9 (see actual BMI)     Encouraged healthy diet and lifestyle changes to affect sustainable weight loss.       Relevant Orders   Basic metabolic panel   Knee crepitus    Bilateral - ?early arthritis vs patellofemoral chondromalacia without pain - consider baseline xrays      Hypothyroidism    Chronic, update labs.       Relevant Medications   levothyroxine (SYNTHROID, LEVOTHROID) 112 MCG tablet   Other Relevant Orders   TSH   T4, free   Health maintenance examination - Primary    Preventative protocols reviewed and updated unless pt declined. Discussed healthy diet and lifestyle.       Asthma    [employer] application form filled out for patient.  Chronic, stable on daily symbicort.       Relevant Medications   albuterol (PROAIR HFA) 108 (90 Base) MCG/ACT inhaler       Meds ordered this encounter  Medications  . levothyroxine (SYNTHROID, LEVOTHROID) 112 MCG tablet    Sig: Take 1 tablet (112 mcg total) by mouth daily.    Dispense:  90 tablet    Refill:  3  . albuterol (PROAIR HFA) 108 (90 Base) MCG/ACT inhaler    Sig: Inhale 1-2 puffs into the lungs every 6 (six) hours as needed.    Dispense:  1 Inhaler    Refill:  3   Orders Placed This Encounter  Procedures  . HM MAMMOGRAPHY    This external order was created through the Results Console.  Marland Kitchen HM PAP SMEAR    This external order was created through the Results Console.  . Basic metabolic panel  . TSH  . T4, free    Follow up plan: Return in about 1 year (around 10/24/2018).  Eustaquio Boyden, MD

## 2017-10-23 NOTE — Assessment & Plan Note (Signed)
Bilateral - ?early arthritis vs patellofemoral chondromalacia without pain - consider baseline xrays

## 2017-10-24 LAB — BASIC METABOLIC PANEL
BUN: 13 mg/dL (ref 6–23)
CHLORIDE: 106 meq/L (ref 96–112)
CO2: 24 meq/L (ref 19–32)
Calcium: 9 mg/dL (ref 8.4–10.5)
Creatinine, Ser: 0.79 mg/dL (ref 0.40–1.20)
GFR: 84.41 mL/min (ref >60.00)
GLUCOSE: 90 mg/dL (ref 70–99)
POTASSIUM: 4.3 meq/L (ref 3.5–5.1)
SODIUM: 139 meq/L (ref 135–145)

## 2017-10-24 LAB — TSH: TSH: 0.85 u[IU]/mL (ref 0.35–4.50)

## 2017-10-24 LAB — T4, FREE: FREE T4: 0.79 ng/dL (ref 0.60–1.60)

## 2018-01-26 ENCOUNTER — Other Ambulatory Visit: Payer: Self-pay | Admitting: Family Medicine

## 2018-03-24 ENCOUNTER — Encounter: Payer: Self-pay | Admitting: Family Medicine

## 2018-03-24 NOTE — Telephone Encounter (Signed)
Is it ok to print a refill for the pt and fax it?

## 2018-03-25 ENCOUNTER — Telehealth: Payer: Self-pay

## 2018-03-25 MED ORDER — BUDESONIDE-FORMOTEROL FUMARATE 80-4.5 MCG/ACT IN AERO: 1.0000 | INHALATION_SPRAY | Freq: Two times a day (BID) | RESPIRATORY_TRACT | 11 refills | Status: DC

## 2018-03-25 NOTE — Telephone Encounter (Addendum)
Faxed rx.  Notified pt via MyChart.  

## 2018-03-25 NOTE — Telephone Encounter (Signed)
Yes please. Thank you

## 2018-03-25 NOTE — Telephone Encounter (Signed)
Received MyChart message from pt requesting rx for Symbicort be faxed to [facility] at [phone], attention Dre. [See Pt Msg, 03/24/18.]  Printed rx, per Dr. Reece AgarG.  Placed in Dr. Timoteo ExposeG's box to be signed.

## 2018-03-25 NOTE — Telephone Encounter (Addendum)
Rx faxed and pt notified via MyCharrt.

## 2018-03-25 NOTE — Telephone Encounter (Signed)
Rx signed and in Lisa's box

## 2018-06-09 ENCOUNTER — Encounter: Payer: Self-pay | Admitting: Family Medicine

## 2018-06-12 ENCOUNTER — Other Ambulatory Visit: Payer: Self-pay

## 2018-06-12 MED ORDER — BUDESONIDE-FORMOTEROL FUMARATE 80-4.5 MCG/ACT IN AERO: 1.0000 | INHALATION_SPRAY | Freq: Two times a day (BID) | RESPIRATORY_TRACT | 11 refills | Status: DC

## 2018-06-12 NOTE — Telephone Encounter (Addendum)
Received fax from [facility] stating the number dispensed (10.2 inhaler) on Symbicort rx is an invalid amt. They are requesting an updated quantity be entered and a new rx be submitted.   Spoke with [facility] stating that is how rx was previously written.  Representative acknowledges that and states they have had to get more detailed with prescriptions.  Suggests write rx for #1 inhaler/11 refills for 10.2 g and fax. Plz advise.

## 2018-06-12 NOTE — Telephone Encounter (Signed)
Printed.  Thanks.  

## 2018-06-13 NOTE — Telephone Encounter (Signed)
Faxed rx to [facility] at [phone].

## 2018-07-16 ENCOUNTER — Other Ambulatory Visit: Payer: Self-pay

## 2018-07-16 ENCOUNTER — Emergency Department (HOSPITAL_COMMUNITY): Payer: PRIVATE HEALTH INSURANCE

## 2018-07-16 ENCOUNTER — Emergency Department (HOSPITAL_COMMUNITY)
Admission: EM | Admit: 2018-07-16 | Discharge: 2018-07-16 | Disposition: A | Discharge status: Home / Self Care | Payer: PRIVATE HEALTH INSURANCE | Attending: Emergency Medicine | Admitting: Emergency Medicine

## 2018-07-16 ENCOUNTER — Encounter (HOSPITAL_COMMUNITY): Payer: Self-pay | Admitting: Emergency Medicine

## 2018-07-16 DIAGNOSIS — S8992XA Unspecified injury of left lower leg, initial encounter: Secondary | ICD-10-CM | POA: Diagnosis present

## 2018-07-16 DIAGNOSIS — Y929 Unspecified place or not applicable: Secondary | ICD-10-CM | POA: Insufficient documentation

## 2018-07-16 DIAGNOSIS — J45909 Unspecified asthma, uncomplicated: Secondary | ICD-10-CM | POA: Diagnosis not present

## 2018-07-16 DIAGNOSIS — Y999 Unspecified external cause status: Secondary | ICD-10-CM | POA: Diagnosis not present

## 2018-07-16 DIAGNOSIS — S82832A Other fracture of upper and lower end of left fibula, initial encounter for closed fracture: Secondary | ICD-10-CM | POA: Insufficient documentation

## 2018-07-16 DIAGNOSIS — Z87891 Personal history of nicotine dependence: Secondary | ICD-10-CM | POA: Insufficient documentation

## 2018-07-16 DIAGNOSIS — Y9301 Activity, walking, marching and hiking: Secondary | ICD-10-CM | POA: Insufficient documentation

## 2018-07-16 DIAGNOSIS — W109XXA Fall (on) (from) unspecified stairs and steps, initial encounter: Secondary | ICD-10-CM | POA: Insufficient documentation

## 2018-07-16 DIAGNOSIS — E039 Hypothyroidism, unspecified: Secondary | ICD-10-CM | POA: Diagnosis not present

## 2018-07-16 DIAGNOSIS — Z79899 Other long term (current) drug therapy: Secondary | ICD-10-CM | POA: Insufficient documentation

## 2018-07-16 MED ORDER — OXYCODONE-ACETAMINOPHEN 5-325 MG PO TABS
1.0000 | ORAL_TABLET | Freq: Once | ORAL | Status: AC
  Administered 2018-07-16: 1 via ORAL

## 2018-07-16 MED ORDER — OXYCODONE-ACETAMINOPHEN 5-325 MG PO TABS
ORAL_TABLET | ORAL | Status: AC
  Filled 2018-07-16: qty 1

## 2018-07-16 MED ORDER — HYDROCODONE-ACETAMINOPHEN 5-325 MG PO TABS: 2.0000 | ORAL_TABLET | ORAL | 0 refills | Status: DC | PRN

## 2018-07-16 MED ORDER — IBUPROFEN 800 MG PO TABS: 800.0000 mg | ORAL_TABLET | Freq: Once | ORAL | Status: DC

## 2018-07-16 NOTE — ED Triage Notes (Signed)
Patient states she fell down 2-3 steps today and is complaining of pain to left ankle. Patient tearful in triage.

## 2018-07-17 ENCOUNTER — Telehealth: Payer: Self-pay | Admitting: Orthopedic Surgery

## 2018-07-17 NOTE — Telephone Encounter (Signed)
Patient called to set up ER Fol/Up appointment with Dr. Romeo Apple. She went to [facility] ER yesterday and was told to see Dr. Romeo Apple. He wasn't on call yesterday, but I did offer her appointment on 3/16 at 9:50 am and she was going to contact a orthopedist in [place] to see if she could be seen sooner. She told me she would give me a call back if she needs to get Monday's appointment.

## 2018-07-17 NOTE — ED Provider Notes (Signed)
[facility] EMERGENCY DEPARTMENT Provider Note   CSN: [id number] Arrival date & time: 07/16/18  1615    History   Chief Complaint Chief Complaint  Patient presents with  . Ankle Injury    HPI Tabitha Spence is a 43 y.o. female.     The history is provided by the patient. No language interpreter was used.  Ankle Injury  This is a new problem. The current episode started yesterday. The problem occurs constantly. The problem has not changed since onset.Nothing aggravates the symptoms. Nothing relieves the symptoms. She has tried nothing for the symptoms. The treatment provided no relief.  Pt reports she fell down stairs Pt complains of swelling and pain   Past Medical History:  Diagnosis Date  . Asthma   . Frequent headaches   . History of chicken pox   . Hypothyroidism   . Obesity 03/06/2013  . Perennial allergic rhinitis     Patient Active Problem List   Diagnosis Date Noted  . Knee crepitus 10/23/2017  . Health maintenance examination 09/03/2013  . Obesity, Class I, BMI 30.0-34.9 (see actual BMI) 03/06/2013  . Hypothyroidism 03/06/2013  . Asthma   . Perennial allergic rhinitis     Past Surgical History:  Procedure Laterality Date  . APPENDECTOMY  1999     OB History   No obstetric history on file.      Home Medications    Prior to Admission medications   Medication Sig Start Date End Date Taking? Authorizing Provider  albuterol (PROVENTIL HFA;VENTOLIN HFA) 108 (90 Base) MCG/ACT inhaler INHALE 1-2 PUFFS INTO THE LUNGS EVERY 6 (SIX) HOURS AS NEEDED. 01/27/18   Eustaquio Boyden, MD  budesonide-formoterol [facility]) 80-4.5 MCG/ACT inhaler Inhale 1 puff into the lungs 2 (two) times daily. 06/12/18   Joaquim Nam, MD  HYDROcodone-acetaminophen (NORCO/VICODIN) 5-325 MG tablet Take 2 tablets by mouth every 4 (four) hours as needed. 07/16/18   Elson Areas, PA-C  levocetirizine (XYZAL ALLERGY 24HR) 5 MG tablet  01/05/17   [provider]   levothyroxine (SYNTHROID, LEVOTHROID) 112 MCG tablet Take 1 tablet (112 mcg total) by mouth daily. 10/23/17   Eustaquio Boyden, MD  norgestimate-ethinyl estradiol (SPRINTEC 28) 0.25-35 MG-MCG tablet Take 1 tablet by mouth daily. 01/05/17   [provider]    Family History Family History  Problem Relation Age of Onset  . Cancer Paternal Grandmother        breast  . Cancer Maternal Grandfather        blood  . Hypertension Mother   . Arthritis Maternal Grandmother   . Stroke Maternal Grandmother 90  . CAD Neg Hx   . Diabetes Neg Hx     Social History Social History   Tobacco Use  . Smoking status: Former Smoker    Packs/day: 1.00    Years: 8.00    Pack years: 8.00    Types: Cigarettes    Last attempt to quit: 05/08/1999    Years since quitting: 19.2  . Smokeless tobacco: Never Used  Substance Use Topics  . Alcohol use: No  . Drug use: No     Allergies   Flonase [fluticasone propionate]   Review of Systems Review of Systems  Musculoskeletal: Positive for arthralgias and joint swelling.  All other systems reviewed and are negative.    Physical Exam Updated Vital Signs BP (!) 146/81 (BP Location: Right Arm)   Pulse 94   Temp 98.4 F (36.9 C) (Oral)   Resp 16  Ht 5\' 6"  (1.676 m)   Wt 99.8 kg   LMP 06/24/2018   SpO2 99%   BMI 35.51 kg/m   Physical Exam Vitals signs reviewed.  Cardiovascular:     Rate and Rhythm: Normal rate.  Pulmonary:     Effort: Pulmonary effort is normal.  Musculoskeletal:        General: Swelling, tenderness, deformity and signs of injury present.  Skin:    General: Skin is warm.  Neurological:     General: No focal deficit present.     Mental Status: She is alert.  Psychiatric:        Mood and Affect: Mood normal.      ED Treatments / Results  Labs (all labs ordered are listed, but only abnormal results are displayed) Labs Reviewed - No data to display  EKG None  Radiology Dg Tibia/fibula Left  Result  Date: 07/16/2018 CLINICAL DATA:  Fall, distal fibular fracture EXAM: LEFT TIBIA AND FIBULA - 2 VIEW COMPARISON:  Ankle radiographs dated 07/16/2018 FINDINGS: No evidence of fracture or dislocation involving the proximal tibia or fibula. Mildly displaced oblique distal fibular fracture, best evaluated on the lateral view. Visualized soft tissues are within normal limits. IMPRESSION: Mildly displaced oblique distal fibular fracture, better visualized on dedicated ankle radiographs. Otherwise negative. Electronically Signed   By: Charline Bills M.D.   On: 07/16/2018 17:46   Dg Ankle Complete Left  Result Date: 07/16/2018 CLINICAL DATA:  Status post fall down steps, with medial and lateral malleolar pain, extending up the leg. Initial encounter. EXAM: LEFT ANKLE COMPLETE - 3+ VIEW COMPARISON:  None. FINDINGS: There is a mildly displaced oblique fracture through the distal fibula, with lateral and posterior displacement. There is associated medial widening of the ankle mortise. Surrounding soft tissue swelling is noted. No significant talar subluxation is seen at this time. The interosseous space is grossly unremarkable. IMPRESSION: Mildly displaced oblique fracture through the distal fibula, with lateral and posterior displacement. Associated medial widening of the ankle mortise. Radiographs of the proximal fibula could be considered to assess for associated proximal fibular fracture. Electronically Signed   By: Roanna Raider M.D.   On: 07/16/2018 16:57    Procedures Procedures (including critical care time)  Medications Ordered in ED Medications  oxyCODONE-acetaminophen (PERCOCET/ROXICET) 5-325 MG per tablet 1 tablet (1 tablet Oral Given 07/16/18 1626)     Initial Impression / Assessment and Plan / ED Course  I have reviewed the triage vital signs and the nursing notes.  Pertinent labs & imaging results that were available during my care of the patient were reviewed by me and considered in my  medical decision making (see chart for details).        MDM  Xrays reviewed and discussed with pt.  Pt placed in a posterior splint and given crutches.  Pt advised to follow up with Dr. Romeo Apple.  She is advised to call in am for appointment.   Final Clinical Impressions(s) / ED Diagnoses   Final diagnoses:  Closed fracture of distal end of left fibula, unspecified fracture morphology, initial encounter    ED Discharge Orders         Ordered    HYDROcodone-acetaminophen (NORCO/VICODIN) 5-325 MG tablet  Every 4 hours PRN     07/16/18 1842        An After Visit Summary was printed and given to the patient.    Elson Areas, PA-C 07/17/18 0051    Samuel Jester, DO 07/17/18 2236

## 2018-07-18 ENCOUNTER — Encounter (INDEPENDENT_AMBULATORY_CARE_PROVIDER_SITE_OTHER): Payer: Self-pay | Admitting: Physician Assistant

## 2018-07-18 ENCOUNTER — Ambulatory Visit (INDEPENDENT_AMBULATORY_CARE_PROVIDER_SITE_OTHER): Payer: PRIVATE HEALTH INSURANCE | Admitting: Orthopaedic Surgery

## 2018-07-18 ENCOUNTER — Other Ambulatory Visit: Payer: Self-pay

## 2018-07-18 DIAGNOSIS — E039 Hypothyroidism, unspecified: Secondary | ICD-10-CM | POA: Diagnosis not present

## 2018-07-18 DIAGNOSIS — S8262XA Displaced fracture of lateral malleolus of left fibula, initial encounter for closed fracture: Secondary | ICD-10-CM

## 2018-07-18 DIAGNOSIS — Z6830 Body mass index (BMI) 30.0-30.9, adult: Secondary | ICD-10-CM

## 2018-07-18 DIAGNOSIS — S82839A Other fracture of upper and lower end of unspecified fibula, initial encounter for closed fracture: Secondary | ICD-10-CM | POA: Insufficient documentation

## 2018-07-18 DIAGNOSIS — W108XXA Fall (on) (from) other stairs and steps, initial encounter: Secondary | ICD-10-CM

## 2018-07-18 MED ORDER — TRAMADOL HCL 50 MG PO TABS: 50.0000 mg | ORAL_TABLET | Freq: Four times a day (QID) | ORAL | 0 refills | Status: DC | PRN

## 2018-07-18 MED ORDER — ONDANSETRON HCL 4 MG PO TABS: 4.0000 mg | ORAL_TABLET | Freq: Three times a day (TID) | ORAL | 0 refills | Status: DC | PRN

## 2018-07-18 NOTE — Progress Notes (Signed)
Office Visit Note   Patient: Tabitha Spence           Date of Birth: 20-May-1975           MRN: [id number] Visit Date: 07/18/2018              Requested by: Eustaquio Boyden, MD [address] [place], [place] [address] PCP: Eustaquio Boyden, MD   Assessment & Plan: Visit Diagnoses:  1. Displaced fracture of distal end of fibula     Plan: Impression is left ankle displaced distal fibula fracture and syndesmosis injury.  We will place the patient in a new short leg splint.  She will elevate as much as possible over the next several days.  I have called in tramadol to take as needed.  She will follow-up with [place] Tuesday for surgical consultation with Dr. Deno Etienne.  Follow-Up Instructions: Return in about 4 days (around 07/22/2018).   Orders:  No orders of the defined types were placed in this encounter.  Meds ordered this encounter  Medications  . traMADol (ULTRAM) 50 MG tablet    Sig: Take 1-2 tablets (50-100 mg total) by mouth 4 (four) times daily as needed.    Dispense:  30 tablet    Refill:  0  . ondansetron (ZOFRAN) 4 MG tablet    Sig: Take 1 tablet (4 mg total) by mouth every 8 (eight) hours as needed for nausea or vomiting.    Dispense:  20 tablet    Refill:  0      Procedures: No procedures performed   Clinical Data: No additional findings.   Subjective: Chief Complaint  Patient presents with  . Left Ankle - Pain  . Left Leg - Pain    HPI patient is a pleasant 43 year old female presents to our clinic today following an injury to her left ankle.  On Wednesday, 07/16/2018 she is going down a set of stairs when she fell twisting her left ankle.  She was seen in the ED where x-rays were obtained.  These demonstrated a displaced distal fibula fracture with widening of the mortise.  She was placed in a short leg splint.  She comes in today for further evaluation treatment recommendation.  She has been compliant with the splint nonweightbearing.  She is having  moderate pain which is mildly relieved with Norco.  This does however seem to make her nauseous.  Review of Systems as detailed in HPI.  All others reviewed and are negative.   Objective: Vital Signs: LMP 06/24/2018   Physical Exam well-developed well-nourished female in no acute distress.  Alert and oriented x3.  Ortho Exam examination of the left ankle reveals marked swelling.  Moderate medial sided ecchymosis.  No fracture blisters.  Calf is soft and nontender.  She is neurovascular intact distally.  Specialty Comments:  No specialty comments available.  Imaging: No new imaging   PMFS History: Patient Active Problem List   Diagnosis Date Noted  . Displaced fracture of distal end of fibula 07/18/2018  . Knee crepitus 10/23/2017  . Health maintenance examination 09/03/2013  . Obesity, Class I, BMI 30.0-34.9 (see actual BMI) 03/06/2013  . Hypothyroidism 03/06/2013  . Asthma   . Perennial allergic rhinitis    Past Medical History:  Diagnosis Date  . Asthma   . Frequent headaches   . History of chicken pox   . Hypothyroidism   . Obesity 03/06/2013  . Perennial allergic rhinitis     Family History  Problem  Relation Age of Onset  . Cancer Paternal Grandmother        breast  . Cancer Maternal Grandfather        blood  . Hypertension Mother   . Arthritis Maternal Grandmother   . Stroke Maternal Grandmother 90  . CAD Neg Hx   . Diabetes Neg Hx     Past Surgical History:  Procedure Laterality Date  . APPENDECTOMY  1999   Social History   Occupational History  . Occupation: Asst Interior and spatial designer at [employer]    Employer: [employer]  Tobacco Use  . Smoking status: Former Smoker    Packs/day: 1.00    Years: 8.00    Pack years: 8.00    Types: Cigarettes    Last attempt to quit: 05/08/1999    Years since quitting: 19.2  . Smokeless tobacco: Never Used  Substance and Sexual Activity  . Alcohol use: No  . Drug use: No  . Sexual activity: Not on file

## 2018-07-22 ENCOUNTER — Other Ambulatory Visit: Payer: Self-pay

## 2018-07-22 ENCOUNTER — Encounter (INDEPENDENT_AMBULATORY_CARE_PROVIDER_SITE_OTHER): Payer: Self-pay | Admitting: Orthopaedic Surgery

## 2018-07-22 ENCOUNTER — Ambulatory Visit (INDEPENDENT_AMBULATORY_CARE_PROVIDER_SITE_OTHER): Payer: PRIVATE HEALTH INSURANCE | Admitting: Orthopaedic Surgery

## 2018-07-22 DIAGNOSIS — S82839A Other fracture of upper and lower end of unspecified fibula, initial encounter for closed fracture: Secondary | ICD-10-CM | POA: Diagnosis not present

## 2018-07-22 NOTE — Progress Notes (Signed)
   Office Visit Note   Patient: Tabitha Spence           Date of Birth: 1975-05-17           MRN: [id number] Visit Date: 07/22/2018              Requested by: Eustaquio Boyden, MD [address] [place], [place] [address] PCP: Eustaquio Boyden, MD   Assessment & Plan: Visit Diagnoses:  1. Displaced fracture of distal end of fibula     Plan: At this point the swelling has improved enough to safely proceed with surgical fixation.  Details of the surgery as well as risks and benefits were discussed with the patient.  Risks include infection, DVT, hardware irritation, numbness, stiffness.  She understands and elects to proceed.  We will schedule her for later this week.  Follow-Up Instructions: Return for 2 week postop visit.   Orders:  No orders of the defined types were placed in this encounter.  No orders of the defined types were placed in this encounter.     Procedures: No procedures performed   Clinical Data: No additional findings.   Subjective: Chief Complaint  Patient presents with  . Left Ankle - Pain, Follow-up    Tabitha Spence follows up today for recheck of her swelling in her fibula fracture.  She was seen last week by Mardella Layman and given follow-up today to allow the swelling to improve.   Review of Systems   Objective: Vital Signs: LMP 06/24/2018   Physical Exam  Ortho Exam Left ankle shows mild to moderate swelling with bruising on the medial aspect of the ankle.  Neurovascular intact. Specialty Comments:  No specialty comments available.  Imaging: No results found.   PMFS History: Patient Active Problem List   Diagnosis Date Noted  . Displaced fracture of distal end of fibula 07/18/2018  . Knee crepitus 10/23/2017  . Health maintenance examination 09/03/2013  . Obesity, Class I, BMI 30.0-34.9 (see actual BMI) 03/06/2013  . Hypothyroidism 03/06/2013  . Asthma   . Perennial allergic rhinitis    Past Medical History:  Diagnosis Date  .  Asthma   . Frequent headaches   . History of chicken pox   . Hypothyroidism   . Obesity 03/06/2013  . Perennial allergic rhinitis     Family History  Problem Relation Age of Onset  . Cancer Paternal Grandmother        breast  . Cancer Maternal Grandfather        blood  . Hypertension Mother   . Arthritis Maternal Grandmother   . Stroke Maternal Grandmother 90  . CAD Neg Hx   . Diabetes Neg Hx     Past Surgical History:  Procedure Laterality Date  . APPENDECTOMY  1999   Social History   Occupational History  . Occupation: Asst Interior and spatial designer at [employer]    Employer: [employer]  Tobacco Use  . Smoking status: Former Smoker    Packs/day: 1.00    Years: 8.00    Pack years: 8.00    Types: Cigarettes    Last attempt to quit: 05/08/1999    Years since quitting: 19.2  . Smokeless tobacco: Never Used  Substance and Sexual Activity  . Alcohol use: No  . Drug use: No  . Sexual activity: Not on file

## 2018-07-23 ENCOUNTER — Other Ambulatory Visit: Payer: Self-pay

## 2018-07-23 ENCOUNTER — Encounter (HOSPITAL_BASED_OUTPATIENT_CLINIC_OR_DEPARTMENT_OTHER): Payer: Self-pay | Admitting: *Deleted

## 2018-07-23 ENCOUNTER — Encounter (HOSPITAL_BASED_OUTPATIENT_CLINIC_OR_DEPARTMENT_OTHER)
Admission: RE | Admit: 2018-07-23 | Discharge: 2018-07-23 | Disposition: A | Discharge status: Home / Self Care | Payer: PRIVATE HEALTH INSURANCE | Source: Ambulatory Visit | Attending: Orthopaedic Surgery | Admitting: Orthopaedic Surgery

## 2018-07-23 DIAGNOSIS — S8262XA Displaced fracture of lateral malleolus of left fibula, initial encounter for closed fracture: Secondary | ICD-10-CM | POA: Diagnosis not present

## 2018-07-23 DIAGNOSIS — Z01812 Encounter for preprocedural laboratory examination: Secondary | ICD-10-CM | POA: Insufficient documentation

## 2018-07-23 DIAGNOSIS — J45909 Unspecified asthma, uncomplicated: Secondary | ICD-10-CM | POA: Diagnosis not present

## 2018-07-23 DIAGNOSIS — Z79899 Other long term (current) drug therapy: Secondary | ICD-10-CM | POA: Diagnosis not present

## 2018-07-23 DIAGNOSIS — Z79890 Hormone replacement therapy (postmenopausal): Secondary | ICD-10-CM | POA: Diagnosis not present

## 2018-07-23 DIAGNOSIS — E039 Hypothyroidism, unspecified: Secondary | ICD-10-CM | POA: Diagnosis not present

## 2018-07-23 DIAGNOSIS — Z87891 Personal history of nicotine dependence: Secondary | ICD-10-CM | POA: Diagnosis not present

## 2018-07-23 DIAGNOSIS — E669 Obesity, unspecified: Secondary | ICD-10-CM | POA: Diagnosis not present

## 2018-07-23 DIAGNOSIS — X58XXXA Exposure to other specified factors, initial encounter: Secondary | ICD-10-CM | POA: Diagnosis not present

## 2018-07-23 DIAGNOSIS — Z6835 Body mass index (BMI) 35.0-35.9, adult: Secondary | ICD-10-CM | POA: Diagnosis not present

## 2018-07-23 DIAGNOSIS — Z7982 Long term (current) use of aspirin: Secondary | ICD-10-CM | POA: Diagnosis not present

## 2018-07-23 LAB — CBC WITH DIFFERENTIAL/PLATELET
Abs Immature Granulocytes: 0.01 10*3/uL (ref 0.00–0.07)
Basophils Absolute: 0 10*3/uL (ref 0.0–0.1)
Basophils Relative: 1 %
Eosinophils Absolute: 0.4 10*3/uL (ref 0.0–0.5)
Eosinophils Relative: 6 %
HEMATOCRIT: 38.5 % (ref 36.0–46.0)
Hemoglobin: 12.7 g/dL (ref 12.0–15.0)
Immature Granulocytes: 0 %
LYMPHS ABS: 1.7 10*3/uL (ref 0.7–4.0)
Lymphocytes Relative: 27 %
MCH: 29.9 pg (ref 26.0–34.0)
MCHC: 33 g/dL (ref 30.0–36.0)
MCV: 90.6 fL (ref 80.0–100.0)
MONO ABS: 0.2 10*3/uL (ref 0.1–1.0)
Monocytes Relative: 3 %
Neutro Abs: 3.8 10*3/uL (ref 1.7–7.7)
Neutrophils Relative %: 63 %
Platelets: 314 10*3/uL (ref 150–400)
RBC: 4.25 MIL/uL (ref 3.87–5.11)
RDW: 12.6 % (ref 11.5–15.5)
WBC: 6.1 10*3/uL (ref 4.0–10.5)
nRBC: 0 % (ref 0.0–0.2)

## 2018-07-23 LAB — PROTIME-INR
INR: 1.1 (ref 0.8–1.2)
Prothrombin Time: 13.7 seconds (ref 11.4–15.2)

## 2018-07-23 LAB — BASIC METABOLIC PANEL
Anion gap: 11 (ref 5–15)
BUN: 9 mg/dL (ref 6–20)
CO2: 21 mmol/L — ABNORMAL LOW (ref 22–32)
Calcium: 9.3 mg/dL (ref 8.9–10.3)
Chloride: 105 mmol/L (ref 98–111)
Creatinine, Ser: 0.9 mg/dL (ref 0.44–1.00)
GFR calc Af Amer: >60 mL/min (ref >60)
GFR calc non Af Amer: >60 mL/min (ref >60)
GLUCOSE: 120 mg/dL — AB (ref 70–99)
Potassium: 4.5 mmol/L (ref 3.5–5.1)
Sodium: 137 mmol/L (ref 135–145)

## 2018-07-23 LAB — APTT: aPTT: 31 seconds (ref 24–36)

## 2018-07-23 NOTE — Progress Notes (Signed)
Surgical soap given with instructions, pt verbalized understanding.  

## 2018-07-24 ENCOUNTER — Encounter (HOSPITAL_BASED_OUTPATIENT_CLINIC_OR_DEPARTMENT_OTHER)
Admission: RE | Disposition: A | Discharge status: Home / Self Care | Payer: Self-pay | Source: Home / Self Care | Attending: Orthopaedic Surgery

## 2018-07-24 ENCOUNTER — Ambulatory Visit (HOSPITAL_COMMUNITY): Payer: PRIVATE HEALTH INSURANCE

## 2018-07-24 ENCOUNTER — Other Ambulatory Visit: Payer: Self-pay

## 2018-07-24 ENCOUNTER — Ambulatory Visit (HOSPITAL_BASED_OUTPATIENT_CLINIC_OR_DEPARTMENT_OTHER): Payer: PRIVATE HEALTH INSURANCE | Admitting: Anesthesiology

## 2018-07-24 ENCOUNTER — Ambulatory Visit (HOSPITAL_BASED_OUTPATIENT_CLINIC_OR_DEPARTMENT_OTHER)
Admission: RE | Admit: 2018-07-24 | Discharge: 2018-07-24 | Disposition: A | Discharge status: Home / Self Care | Payer: PRIVATE HEALTH INSURANCE | Attending: Orthopaedic Surgery | Admitting: Orthopaedic Surgery

## 2018-07-24 ENCOUNTER — Encounter (HOSPITAL_BASED_OUTPATIENT_CLINIC_OR_DEPARTMENT_OTHER): Payer: Self-pay | Admitting: Anesthesiology

## 2018-07-24 DIAGNOSIS — Z6835 Body mass index (BMI) 35.0-35.9, adult: Secondary | ICD-10-CM | POA: Insufficient documentation

## 2018-07-24 DIAGNOSIS — S8262XA Displaced fracture of lateral malleolus of left fibula, initial encounter for closed fracture: Secondary | ICD-10-CM | POA: Insufficient documentation

## 2018-07-24 DIAGNOSIS — Z79899 Other long term (current) drug therapy: Secondary | ICD-10-CM | POA: Insufficient documentation

## 2018-07-24 DIAGNOSIS — S82839A Other fracture of upper and lower end of unspecified fibula, initial encounter for closed fracture: Secondary | ICD-10-CM | POA: Diagnosis not present

## 2018-07-24 DIAGNOSIS — J45909 Unspecified asthma, uncomplicated: Secondary | ICD-10-CM | POA: Insufficient documentation

## 2018-07-24 DIAGNOSIS — E039 Hypothyroidism, unspecified: Secondary | ICD-10-CM | POA: Insufficient documentation

## 2018-07-24 DIAGNOSIS — E669 Obesity, unspecified: Secondary | ICD-10-CM | POA: Insufficient documentation

## 2018-07-24 DIAGNOSIS — Z7982 Long term (current) use of aspirin: Secondary | ICD-10-CM | POA: Insufficient documentation

## 2018-07-24 DIAGNOSIS — Z87891 Personal history of nicotine dependence: Secondary | ICD-10-CM | POA: Insufficient documentation

## 2018-07-24 DIAGNOSIS — Z79890 Hormone replacement therapy (postmenopausal): Secondary | ICD-10-CM | POA: Insufficient documentation

## 2018-07-24 DIAGNOSIS — T148XXA Other injury of unspecified body region, initial encounter: Secondary | ICD-10-CM

## 2018-07-24 DIAGNOSIS — X58XXXA Exposure to other specified factors, initial encounter: Secondary | ICD-10-CM | POA: Insufficient documentation

## 2018-07-24 HISTORY — PX: ORIF ANKLE FRACTURE: SHX5408

## 2018-07-24 SURGERY — OPEN REDUCTION INTERNAL FIXATION (ORIF) ANKLE FRACTURE
Anesthesia: Regional | Site: Ankle | Laterality: Left

## 2018-07-24 MED ORDER — LACTATED RINGERS IV SOLN: INTRAVENOUS | Status: DC

## 2018-07-24 MED ORDER — ACETAMINOPHEN 500 MG PO TABS
ORAL_TABLET | ORAL | Status: AC
  Filled 2018-07-24: qty 2

## 2018-07-24 MED ORDER — LIDOCAINE HCL (CARDIAC) PF 100 MG/5ML IV SOSY
PREFILLED_SYRINGE | INTRAVENOUS | Status: DC | PRN
  Administered 2018-07-24: 60 mg via INTRAVENOUS

## 2018-07-24 MED ORDER — ACETAMINOPHEN 500 MG PO TABS
1000.0000 mg | ORAL_TABLET | Freq: Once | ORAL | Status: AC
  Administered 2018-07-24: 1000 mg via ORAL

## 2018-07-24 MED ORDER — OXYCODONE HCL 5 MG/5ML PO SOLN: 5.0000 mg | Freq: Once | ORAL | Status: DC | PRN

## 2018-07-24 MED ORDER — CEFAZOLIN SODIUM-DEXTROSE 2-4 GM/100ML-% IV SOLN
2.0000 g | INTRAVENOUS | Status: AC
  Administered 2018-07-24: 2 g via INTRAVENOUS

## 2018-07-24 MED ORDER — MIDAZOLAM HCL 2 MG/2ML IJ SOLN
1.0000 mg | INTRAMUSCULAR | Status: DC | PRN
  Administered 2018-07-24: 2 mg via INTRAVENOUS

## 2018-07-24 MED ORDER — ZINC SULFATE 220 (50 ZN) MG PO CAPS: 220.0000 mg | ORAL_CAPSULE | Freq: Every day | ORAL | 0 refills | Status: DC

## 2018-07-24 MED ORDER — DEXAMETHASONE SODIUM PHOSPHATE 4 MG/ML IJ SOLN
INTRAMUSCULAR | Status: DC | PRN
  Administered 2018-07-24: 10 mg via INTRAVENOUS

## 2018-07-24 MED ORDER — LACTATED RINGERS IV SOLN
INTRAVENOUS | Status: DC
  Administered 2018-07-24: 11:00:00 via INTRAVENOUS

## 2018-07-24 MED ORDER — CHLORHEXIDINE GLUCONATE 4 % EX LIQD: 60.0000 mL | Freq: Once | CUTANEOUS | Status: DC

## 2018-07-24 MED ORDER — OXYCODONE HCL 5 MG PO TABS: 5.0000 mg | ORAL_TABLET | Freq: Once | ORAL | Status: DC | PRN

## 2018-07-24 MED ORDER — FENTANYL CITRATE (PF) 100 MCG/2ML IJ SOLN
INTRAMUSCULAR | Status: AC
  Filled 2018-07-24: qty 2

## 2018-07-24 MED ORDER — OXYCODONE HCL 5 MG PO TABS: 5.0000 mg | ORAL_TABLET | Freq: Three times a day (TID) | ORAL | 0 refills | Status: DC | PRN

## 2018-07-24 MED ORDER — KETOROLAC TROMETHAMINE 30 MG/ML IJ SOLN
INTRAMUSCULAR | Status: AC
  Filled 2018-07-24: qty 1

## 2018-07-24 MED ORDER — KETOROLAC TROMETHAMINE 30 MG/ML IJ SOLN
30.0000 mg | Freq: Once | INTRAMUSCULAR | Status: AC | PRN
  Administered 2018-07-24: 30 mg via INTRAVENOUS

## 2018-07-24 MED ORDER — CEFAZOLIN SODIUM-DEXTROSE 2-4 GM/100ML-% IV SOLN
INTRAVENOUS | Status: AC
  Filled 2018-07-24: qty 100

## 2018-07-24 MED ORDER — ONDANSETRON HCL 4 MG/2ML IJ SOLN
INTRAMUSCULAR | Status: AC
  Filled 2018-07-24: qty 2

## 2018-07-24 MED ORDER — ROPIVACAINE HCL 5 MG/ML IJ SOLN
INTRAMUSCULAR | Status: DC | PRN
  Administered 2018-07-24: 30 mL via PERINEURAL

## 2018-07-24 MED ORDER — LIDOCAINE 2% (20 MG/ML) 5 ML SYRINGE
INTRAMUSCULAR | Status: AC
  Filled 2018-07-24: qty 5

## 2018-07-24 MED ORDER — HYDROMORPHONE HCL 1 MG/ML IJ SOLN: 0.2500 mg | INTRAMUSCULAR | Status: DC | PRN

## 2018-07-24 MED ORDER — MIDAZOLAM HCL 2 MG/2ML IJ SOLN
INTRAMUSCULAR | Status: AC
  Filled 2018-07-24: qty 2

## 2018-07-24 MED ORDER — CALCIUM CARBONATE-VITAMIN D 500-200 MG-UNIT PO TABS: 1.0000 | ORAL_TABLET | Freq: Three times a day (TID) | ORAL | 6 refills | Status: DC

## 2018-07-24 MED ORDER — ONDANSETRON HCL 4 MG/2ML IJ SOLN
INTRAMUSCULAR | Status: DC | PRN
  Administered 2018-07-24: 4 mg via INTRAVENOUS

## 2018-07-24 MED ORDER — DEXAMETHASONE SODIUM PHOSPHATE 10 MG/ML IJ SOLN
INTRAMUSCULAR | Status: AC
  Filled 2018-07-24: qty 1

## 2018-07-24 MED ORDER — ASPIRIN EC 81 MG PO TBEC: 81.0000 mg | DELAYED_RELEASE_TABLET | Freq: Two times a day (BID) | ORAL | 0 refills | Status: DC

## 2018-07-24 MED ORDER — SCOPOLAMINE 1 MG/3DAYS TD PT72: 1.0000 | MEDICATED_PATCH | Freq: Once | TRANSDERMAL | Status: DC | PRN

## 2018-07-24 MED ORDER — FENTANYL CITRATE (PF) 100 MCG/2ML IJ SOLN
50.0000 ug | INTRAMUSCULAR | Status: DC | PRN
  Administered 2018-07-24: 50 ug via INTRAVENOUS

## 2018-07-24 MED ORDER — PROMETHAZINE HCL 25 MG/ML IJ SOLN: 6.2500 mg | INTRAMUSCULAR | Status: DC | PRN

## 2018-07-24 MED ORDER — PROPOFOL 10 MG/ML IV BOLUS
INTRAVENOUS | Status: DC | PRN
  Administered 2018-07-24: 200 mg via INTRAVENOUS

## 2018-07-24 SURGICAL SUPPLY — 90 items
APL PRP STRL LF DISP 70% ISPRP (MISCELLANEOUS) ×2
BANDAGE ACE 4X5 VEL STRL LF (GAUZE/BANDAGES/DRESSINGS) ×2 IMPLANT
BANDAGE ACE 6X5 VEL STRL LF (GAUZE/BANDAGES/DRESSINGS) ×3 IMPLANT
BANDAGE ESMARK 6X9 LF (GAUZE/BANDAGES/DRESSINGS) ×1 IMPLANT
BIT DRILL QC 2.5MM SHRT EVO SM (DRILL) IMPLANT
BLADE HEX COATED 2.75 (ELECTRODE) ×3 IMPLANT
BLADE SURG 15 STRL LF DISP TIS (BLADE) ×2 IMPLANT
BLADE SURG 15 STRL SS (BLADE) ×6
BNDG CMPR 9X6 STRL LF SNTH (GAUZE/BANDAGES/DRESSINGS) ×1
BNDG COHESIVE 6X5 TAN STRL LF (GAUZE/BANDAGES/DRESSINGS) ×3 IMPLANT
BNDG ESMARK 6X9 LF (GAUZE/BANDAGES/DRESSINGS) ×3
BRUSH SCRUB EZ PLAIN DRY (MISCELLANEOUS) ×3 IMPLANT
CANISTER SUCT 1200ML W/VALVE (MISCELLANEOUS) ×3 IMPLANT
CHLORAPREP W/TINT 26 (MISCELLANEOUS) ×4 IMPLANT
COVER BACK TABLE 60X90IN (DRAPES) ×3 IMPLANT
COVER MAYO STAND STRL (DRAPES) IMPLANT
COVER WAND RF STERILE (DRAPES) IMPLANT
CUFF TOURN SGL QUICK 34 (TOURNIQUET CUFF) ×3
CUFF TRNQT CYL 34X4.125X (TOURNIQUET CUFF) IMPLANT
DECANTER SPIKE VIAL GLASS SM (MISCELLANEOUS) IMPLANT
DRAPE C-ARM 42X72 X-RAY (DRAPES) ×3 IMPLANT
DRAPE C-ARMOR (DRAPES) ×3 IMPLANT
DRAPE EXTREMITY T 121X128X90 (DISPOSABLE) ×3 IMPLANT
DRAPE IMP U-DRAPE 54X76 (DRAPES) ×3 IMPLANT
DRAPE INCISE IOBAN 66X45 STRL (DRAPES) IMPLANT
DRAPE SURG 17X23 STRL (DRAPES) ×4 IMPLANT
DRILL QC 2.5MM SHORT EVOS SM (DRILL) ×3
DRSG PAD ABDOMINAL 8X10 ST (GAUZE/BANDAGES/DRESSINGS) ×6 IMPLANT
DURAPREP 26ML APPLICATOR (WOUND CARE) ×2 IMPLANT
ELECT REM PT RETURN 9FT ADLT (ELECTROSURGICAL) ×3
ELECTRODE REM PT RTRN 9FT ADLT (ELECTROSURGICAL) ×1 IMPLANT
GAUZE SPONGE 4X4 12PLY STRL (GAUZE/BANDAGES/DRESSINGS) ×3 IMPLANT
GAUZE XEROFORM 1X8 LF (GAUZE/BANDAGES/DRESSINGS) ×3 IMPLANT
GLOVE BIO SURGEON STRL SZ 6.5 (GLOVE) ×2 IMPLANT
GLOVE BIO SURGEONS STRL SZ 6.5 (GLOVE) ×2
GLOVE BIOGEL PI IND STRL 7.0 (GLOVE) ×1 IMPLANT
GLOVE BIOGEL PI INDICATOR 7.0 (GLOVE) ×2
GLOVE ECLIPSE 7.0 STRL STRAW (GLOVE) ×3 IMPLANT
GLOVE EXAM NITRILE MD LF STRL (GLOVE) ×2 IMPLANT
GLOVE SKINSENSE NS SZ7.5 (GLOVE) ×2
GLOVE SKINSENSE STRL SZ7.5 (GLOVE) ×1 IMPLANT
GLOVE SURG SYN 7.5  E (GLOVE) ×2
GLOVE SURG SYN 7.5 E (GLOVE) ×1 IMPLANT
GLOVE SURG SYN 7.5 PF PI (GLOVE) ×1 IMPLANT
GOWN STRL REIN XL XLG (GOWN DISPOSABLE) ×3 IMPLANT
GOWN STRL REUS W/ TWL LRG LVL3 (GOWN DISPOSABLE) ×1 IMPLANT
GOWN STRL REUS W/ TWL XL LVL3 (GOWN DISPOSABLE) ×1 IMPLANT
GOWN STRL REUS W/TWL LRG LVL3 (GOWN DISPOSABLE) ×3
GOWN STRL REUS W/TWL XL LVL3 (GOWN DISPOSABLE) ×3
MANIFOLD NEPTUNE II (INSTRUMENTS) ×3 IMPLANT
NEEDLE HYPO 22GX1.5 SAFETY (NEEDLE) IMPLANT
NS IRRIG 1000ML POUR BTL (IV SOLUTION) ×7 IMPLANT
PACK BASIN DAY SURGERY FS (CUSTOM PROCEDURE TRAY) ×3 IMPLANT
PAD CAST 3X4 CTTN HI CHSV (CAST SUPPLIES) IMPLANT
PAD CAST 4YDX4 CTTN HI CHSV (CAST SUPPLIES) IMPLANT
PADDING CAST COTTON 3X4 STRL (CAST SUPPLIES)
PADDING CAST COTTON 4X4 STRL (CAST SUPPLIES) ×3
PADDING CAST COTTON 6X4 STRL (CAST SUPPLIES) IMPLANT
PADDING CAST SYN 6 (CAST SUPPLIES) ×2
PADDING CAST SYNTHETIC 4 (CAST SUPPLIES) ×6
PADDING CAST SYNTHETIC 4X4 STR (CAST SUPPLIES) ×1 IMPLANT
PADDING CAST SYNTHETIC 6X4 NS (CAST SUPPLIES) ×1 IMPLANT
PENCIL BUTTON HOLSTER BLD 10FT (ELECTRODE) ×3 IMPLANT
PLATE 6H 1/3 TUBULAR (Plate) ×2 IMPLANT
SCREW CANC EVOS 4.7X12 FT (Screw) ×2 IMPLANT
SCREW CORT 3.5X11 ST EVOS (Screw) ×2 IMPLANT
SCREW CORT 3.5X14 ST EVOS (Screw) ×2 IMPLANT
SCREW CORT EVOS ST 3.5X12 (Screw) ×2 IMPLANT
SCREW OST EVOS FT 4.7X16 (Screw) ×2 IMPLANT
SHEET MEDIUM DRAPE 40X70 STRL (DRAPES) ×3 IMPLANT
SLEEVE SCD COMPRESS KNEE MED (MISCELLANEOUS) ×3 IMPLANT
SPLINT FIBERGLASS 4X30 (CAST SUPPLIES) ×2 IMPLANT
SPONGE LAP 18X18 RF (DISPOSABLE) ×3 IMPLANT
SUCTION FRAZIER HANDLE 10FR (MISCELLANEOUS) ×2
SUCTION TUBE FRAZIER 10FR DISP (MISCELLANEOUS) ×1 IMPLANT
SUT ETHILON 3 0 PS 1 (SUTURE) ×4 IMPLANT
SUT VIC AB 0 CT1 27 (SUTURE) ×3
SUT VIC AB 0 CT1 27XBRD ANBCTR (SUTURE) IMPLANT
SUT VIC AB 2-0 CT1 27 (SUTURE) ×3
SUT VIC AB 2-0 CT1 TAPERPNT 27 (SUTURE) ×1 IMPLANT
SUT VIC AB 3-0 SH 27 (SUTURE)
SUT VIC AB 3-0 SH 27X BRD (SUTURE) IMPLANT
SYR BULB 3OZ (MISCELLANEOUS) ×3 IMPLANT
SYR CONTROL 10ML LL (SYRINGE) IMPLANT
TOWEL GREEN STERILE FF (TOWEL DISPOSABLE) ×3 IMPLANT
TRAY DSU PREP LF (CUSTOM PROCEDURE TRAY) ×3 IMPLANT
TUBE CONNECTING 20'X1/4 (TUBING) ×1
TUBE CONNECTING 20X1/4 (TUBING) ×2 IMPLANT
UNDERPAD 30X30 (UNDERPADS AND DIAPERS) ×3 IMPLANT
YANKAUER SUCT BULB TIP NO VENT (SUCTIONS) ×3 IMPLANT

## 2018-07-24 NOTE — Anesthesia Procedure Notes (Signed)
Anesthesia Regional Block: Popliteal block   Pre-Anesthetic Checklist: ,, timeout performed, Correct Patient, Correct Site, Correct Laterality, Correct Procedure, Correct Position, site marked, Risks and benefits discussed,  Surgical consent,  Pre-op evaluation,  At surgeon's request and post-op pain management  Laterality: Left  Prep: chloraprep       Needles:  Injection technique: Single-shot  Needle Type: Echogenic Stimulator Needle     Needle Length: 10cm  Needle Gauge: 21     Additional Needles:   Procedures:,,,, ultrasound used (permanent image in chart),,,,  Narrative:  Start time: 07/24/2018 11:35 AM End time: 07/24/2018 11:45 AM Injection made incrementally with aspirations every 5 mL.  Performed by: Personally  Anesthesiologist: Leonides Grills, MD  Additional Notes: Functioning IV was confirmed and monitors were applied.  A timeout was performed. Sterile prep, hand hygiene and sterile gloves were used. A 21ga Pajunk echogenic stimulator needle was used. Negative aspiration and negative test dose prior to incremental administration of local anesthetic. The patient tolerated the procedure well.  Ultrasound guidance: relevent anatomy identified, needle position confirmed, local anesthetic spread visualized around nerve(s), vascular puncture avoided.  Image printed for medical record.

## 2018-07-24 NOTE — Progress Notes (Signed)
Assisted Dr. Ellender with left, ultrasound guided, popliteal block. Side rails up, monitors on throughout procedure. See vital signs in flow sheet. Tolerated Procedure well. 

## 2018-07-24 NOTE — Anesthesia Procedure Notes (Signed)
Procedure Name: LMA Insertion Performed by: Axxel Gude W, CRNA Pre-anesthesia Checklist: Patient identified, Emergency Drugs available, Suction available and Patient being monitored Patient Re-evaluated:Patient Re-evaluated prior to induction Oxygen Delivery Method: Circle system utilized Preoxygenation: Pre-oxygenation with 100% oxygen Induction Type: IV induction Ventilation: Mask ventilation without difficulty LMA: LMA inserted LMA Size: 4.0 Number of attempts: 1 Placement Confirmation: positive ETCO2 Tube secured with: Tape Dental Injury: Teeth and Oropharynx as per pre-operative assessment        

## 2018-07-24 NOTE — Anesthesia Postprocedure Evaluation (Signed)
Anesthesia Post Note  Patient: ALEXIE MANGRUM  Procedure(s) Performed: OPEN REDUCTION INTERNAL FIXATION (ORIF) LEFT LATERAL MALLEOLUS (Left Ankle)     Patient location during evaluation: PACU Anesthesia Type: Regional and General Level of consciousness: awake and alert Pain management: pain level controlled Vital Signs Assessment: post-procedure vital signs reviewed and stable Respiratory status: spontaneous breathing, nonlabored ventilation, respiratory function stable and patient connected to nasal cannula oxygen Cardiovascular status: blood pressure returned to baseline and stable Postop Assessment: no apparent nausea or vomiting Anesthetic complications: no    Last Vitals:  Vitals:   07/24/18 1345 07/24/18 1400  BP: 134/81 (!) 144/84  Pulse: 73 77  Resp: 17 18  Temp:  37 C  SpO2: 98% 100%    Last Pain:  Vitals:   07/24/18 1400  TempSrc:   PainSc: 2                  Ryan P Ellender

## 2018-07-24 NOTE — Discharge Instructions (Signed)
° ° °  1. Keep splint clean and dry 2. Elevate foot above level of the heart 3. Take aspirin to prevent blood clots 4. Take pain meds as needed 5. Strict non weight bearing to operative extremity    Post Anesthesia Home Care Instructions  NO TYLENOL until 500pm 07/24/2018.  NO IBUPROFEN until 730pm 07/24/2018.  Activity: Get plenty of rest for the remainder of the day. A responsible individual must stay with you for 24 hours following the procedure.  For the next 24 hours, DO NOT: -Drive a car -Advertising copywriter -Drink alcoholic beverages -Take any medication unless instructed by your physician -Make any legal decisions or sign important papers.  Meals: Start with liquid foods such as gelatin or soup. Progress to regular foods as tolerated. Avoid greasy, spicy, heavy foods. If nausea and/or vomiting occur, drink only clear liquids until the nausea and/or vomiting subsides. Call your physician if vomiting continues.  Special Instructions/Symptoms: Your throat may feel dry or sore from the anesthesia or the breathing tube placed in your throat during surgery. If this causes discomfort, gargle with warm salt water. The discomfort should disappear within 24 hours.  If you had a scopolamine patch placed behind your ear for the management of post- operative nausea and/or vomiting:  1. The medication in the patch is effective for 72 hours, after which it should be removed.  Wrap patch in a tissue and discard in the trash. Wash hands thoroughly with soap and water. 2. You may remove the patch earlier than 72 hours if you experience unpleasant side effects which may include dry mouth, dizziness or visual disturbances. 3. Avoid touching the patch. Wash your hands with soap and water after contact with the patch.

## 2018-07-24 NOTE — Transfer of Care (Signed)
Immediate Anesthesia Transfer of Care Note  Patient: Tabitha Spence  Procedure(s) Performed: OPEN REDUCTION INTERNAL FIXATION (ORIF) LEFT LATERAL MALLEOLUS (Left Ankle)  Patient Location: PACU  Anesthesia Type:General and Regional  Level of Consciousness: awake and sedated  Airway & Oxygen Therapy: Patient Spontanous Breathing and Patient connected to face mask oxygen  Post-op Assessment: Report given to RN and Post -op Vital signs reviewed and stable  Post vital signs: Reviewed and stable  Last Vitals:  Vitals Value Taken Time  BP 128/79 07/24/2018  1:10 PM  Temp    Pulse 76 07/24/2018  1:13 PM  Resp 18 07/24/2018  1:13 PM  SpO2 99 % 07/24/2018  1:13 PM  Vitals shown include unvalidated device data.  Last Pain:  Vitals:   07/24/18 1045  TempSrc: Oral  PainSc: 1          Complications: No apparent anesthesia complications

## 2018-07-24 NOTE — Anesthesia Preprocedure Evaluation (Addendum)
Anesthesia Evaluation  Patient identified by MRN, date of birth, ID band Patient awake    Reviewed: Allergy & Precautions, NPO status , Patient's Chart, lab work & pertinent test results  Airway Mallampati: II  TM Distance: >3 FB Neck ROM: Full    Dental no notable dental hx.    Pulmonary asthma , former smoker,    Pulmonary exam normal breath sounds clear to auscultation       Cardiovascular negative cardio ROS Normal cardiovascular exam Rhythm:Regular Rate:Normal     Neuro/Psych  Headaches, negative psych ROS   GI/Hepatic negative GI ROS, Neg liver ROS,   Endo/Other  Hypothyroidism   Renal/GU negative Renal ROS     Musculoskeletal negative musculoskeletal ROS (+)   Abdominal (+) + obese,   Peds  Hematology negative hematology ROS (+)   Anesthesia Other Findings Left lateral malleolus ankle fracture  Reproductive/Obstetrics                            Anesthesia Physical Anesthesia Plan  ASA: II  Anesthesia Plan: General and Regional   Post-op Pain Management: GA combined w/ Regional for post-op pain   Induction: Intravenous  PONV Risk Score and Plan: 3 and Ondansetron, Dexamethasone, Midazolam and Treatment may vary due to age or medical condition  Airway Management Planned: LMA  Additional Equipment:   Intra-op Plan:   Post-operative Plan: Extubation in OR  Informed Consent: I have reviewed the patients History and Physical, chart, labs and discussed the procedure including the risks, benefits and alternatives for the proposed anesthesia with the patient or authorized representative who has indicated his/her understanding and acceptance.     Dental advisory given  Plan Discussed with: CRNA  Anesthesia Plan Comments:         Anesthesia Quick Evaluation

## 2018-07-24 NOTE — H&P (Signed)
PREOPERATIVE H&P  Chief Complaint: Left lateral malleolus ankle fracture  HPI: Tabitha Spence is a 43 y.o. female who presents for surgical treatment of Left lateral malleolus ankle fracture.  She denies any changes in medical history.  Past Medical History:  Diagnosis Date  . Asthma   . Frequent headaches   . History of chicken pox   . Hypothyroidism   . Obesity 03/06/2013  . Perennial allergic rhinitis    Past Surgical History:  Procedure Laterality Date  . APPENDECTOMY  1999   Social History   Socioeconomic History  . Marital status: Divorced    Spouse name: Not on file  . Number of children: Not on file  . Years of education: Not on file  . Highest education level: Not on file  Occupational History  . Occupation: Asst Interior and spatial designer at [employer]    Employer: [employer]  Social Needs  . Financial resource strain: Not on file  . Food insecurity:    Worry: Not on file    Inability: Not on file  . Transportation needs:    Medical: Not on file    Non-medical: Not on file  Tobacco Use  . Smoking status: Former Smoker    Packs/day: 1.00    Years: 8.00    Pack years: 8.00    Types: Cigarettes    Last attempt to quit: 05/08/1999    Years since quitting: 19.2  . Smokeless tobacco: Never Used  Substance and Sexual Activity  . Alcohol use: No  . Drug use: No  . Sexual activity: Not on file  Lifestyle  . Physical activity:    Days per week: Not on file    Minutes per session: Not on file  . Stress: Not on file  Relationships  . Social connections:    Talks on phone: Not on file    Gets together: Not on file    Attends religious service: Not on file    Active member of club or organization: Not on file    Attends meetings of clubs or organizations: Not on file    Relationship status: Not on file  Other Topics Concern  . Not on file  Social History Narrative   Lives with ex-husband and 2 children, 1 dog   Occupation: Production designer, theatre/television/film at preschool    Edu: college   Activity: walking occasionally   Diet: good water, fruits/vegetables daily   Family History  Problem Relation Age of Onset  . Cancer Paternal Grandmother        breast  . Cancer Maternal Grandfather        blood  . Hypertension Mother   . Arthritis Maternal Grandmother   . Stroke Maternal Grandmother 90  . CAD Neg Hx   . Diabetes Neg Hx    Allergies  Allergen Reactions  . Flonase [Fluticasone Propionate] Itching    Diffuse body itching   Prior to Admission medications   Medication Sig Start Date End Date Taking? Authorizing Provider  acetaminophen (TYLENOL) 500 MG tablet Take 1,000 mg by mouth every 6 (six) hours as needed.   Yes [provider]  albuterol (PROVENTIL HFA;VENTOLIN HFA) 108 (90 Base) MCG/ACT inhaler INHALE 1-2 PUFFS INTO THE LUNGS EVERY 6 (SIX) HOURS AS NEEDED. 01/27/18  Yes Eustaquio Boyden, MD  budesonide-formoterol [facility] 80-4.5 MCG/ACT inhaler Inhale 1 puff into the lungs 2 (two) times daily. 06/12/18  Yes Joaquim Nam, MD  HYDROcodone-acetaminophen (NORCO/VICODIN) 5-325 MG tablet Take 2 tablets by mouth  every 4 (four) hours as needed. 07/16/18  Yes Cheron Schaumann K, PA-C  levocetirizine (XYZAL ALLERGY 24HR) 5 MG tablet  01/05/17  Yes [provider]  levothyroxine (SYNTHROID, LEVOTHROID) 112 MCG tablet Take 1 tablet (112 mcg total) by mouth daily. 10/23/17  Yes Eustaquio Boyden, MD  norgestimate-ethinyl estradiol (SPRINTEC 28) 0.25-35 MG-MCG tablet Take 1 tablet by mouth daily. 01/05/17  Yes [provider]  traMADol (ULTRAM) 50 MG tablet Take 1-2 tablets (50-100 mg total) by mouth 4 (four) times daily as needed. 07/18/18  Yes Cristie Hem, PA-C  aspirin EC 81 MG tablet Take 1 tablet (81 mg total) by mouth 2 (two) times daily. 07/24/18   Tarry Kos, MD  calcium-vitamin D (OSCAL WITH D) 500-200 MG-UNIT tablet Take 1 tablet by mouth 3 (three) times daily. 07/24/18   Tarry Kos, MD  ondansetron (ZOFRAN) 4 MG tablet  Take 1 tablet (4 mg total) by mouth every 8 (eight) hours as needed for nausea or vomiting. 07/18/18   Cristie Hem, PA-C  oxyCODONE (OXY IR/ROXICODONE) 5 MG immediate release tablet Take 1-2 tablets (5-10 mg total) by mouth 3 (three) times daily as needed. 07/24/18   Tarry Kos, MD  zinc sulfate 220 (50 Zn) MG capsule Take 1 capsule (220 mg total) by mouth daily. 07/24/18   Tarry Kos, MD     Positive ROS: All other systems have been reviewed and were otherwise negative with the exception of those mentioned in the HPI and as above.  Physical Exam: General: Alert, no acute distress Cardiovascular: No pedal edema Respiratory: No cyanosis, no use of accessory musculature GI: abdomen soft Skin: No lesions in the area of chief complaint Neurologic: Sensation intact distally Psychiatric: Patient is competent for consent with normal mood and affect Lymphatic: no lymphedema  MUSCULOSKELETAL: exam stable  Assessment: Left lateral malleolus ankle fracture  Plan: Plan for Procedure(s): OPEN REDUCTION INTERNAL FIXATION (ORIF) LEFT LATERAL MALLEOLUS, POSSIBLE SYNDESMOSIS  The risks benefits and alternatives were discussed with the patient including but not limited to the risks of nonoperative treatment, versus surgical intervention including infection, bleeding, nerve injury,  blood clots, cardiopulmonary complications, morbidity, mortality, among others, and they were willing to proceed.   Glee Arvin, MD   07/24/2018 11:50 AM

## 2018-07-24 NOTE — Op Note (Signed)
Date of Surgery: 07/24/2018  INDICATIONS: Tabitha Spence is a 43 y.o.-year-old female who sustained a left ankle fracture; she was indicated for open reduction and internal fixation due to the displaced nature of the articular fracture and came to the operating room today for this procedure. The patient did consent to the procedure after discussion of the risks and benefits.  PREOPERATIVE DIAGNOSIS: left lateral malleolus ankle fracture  POSTOPERATIVE DIAGNOSIS: Same.  PROCEDURE: Open treatment of left ankle fracture with internal fixation. Lateral malleolar CPT [phone]  SURGEON: N. Glee Arvin, M.D.  ASSIST: Tabitha Spence, Tabitha Spence; necessary for the timely completion of procedure and due to complexity of procedure.  ANESTHESIA:  General, popliteal block  TOURNIQUET TIME: less than 45 mins  IV FLUIDS AND URINE: See anesthesia.  ESTIMATED BLOOD LOSS: minimal mL.  IMPLANTS: [employer]  COMPLICATIONS: None.  DESCRIPTION OF PROCEDURE: The patient was brought to the operating room and placed supine on the operating table.  The patient had been signed prior to the procedure and this was documented. The patient had the anesthesia placed by the anesthesiologist.  A nonsterile tourniquet was placed on the upper thigh.  The prep verification and incision time-outs were performed to confirm that this was the correct patient, site, side and location. The patient had an SCD on the opposite lower extremity. The patient did receive antibiotics prior to the incision and was re-dosed during the procedure as needed at indicated intervals.  The patient had the lower extremity prepped and draped in the standard surgical fashion.  The extremity was exsanguinated using an esmarch bandage and the tourniquet was inflated to 300 mm Hg.  An incision was made over the distal fibula over the lateral aspect of the ankle.  Dissection was carried down through the subcutaneous tissue and full-thickness flaps were  elevated off of the fibula.  The fracture was exposed and the fracture line was visualized.  This was relatively transverse fracture which I did not feel was amenable to lag screw fixation therefore a reduction was obtained using a bone clamp and then confirmed under fluoroscopy on orthogonal views.  I then placed a 6 hole semitubular locking plate on the lateral aspect of the fibula at the appropriate position using fluoroscopic guidance.  I then placed a 3.5 mm nonlocking screw through the plate in a bicortical fashion in order to contour the plate down to the bone.  The screw had excellent purchase.  I then placed 2 unicortical 4.0 mm cancellus screws in the distal fibula distal to the fracture in order to contour the distal portion of the plate down to the lateral malleolus.  Each screw had excellent purchase.  I then placed 2 additional nonlocking screws proximal to the fracture in a bicortical fashion each with excellent purchase.  Stress exam of the ankle showed no widening of the medial clear space.  Final x-rays were taken.  The wound was then thoroughly irrigated and closed in layered fashion using 0 Vicryl, 2-0 Vicryl, 3-0 nylon.  Sterile dressings were applied.  Short leg splint was placed.  Patient tolerated procedure well had no immediate complications.  POSTOPERATIVE PLAN: Tabitha Spence will remain nonweightbearing on this leg for approximately 6 weeks; Tabitha Spence will return for suture removal in 2 weeks.  He will be immobilized in a short leg splint and then transitioned to a CAM walker at his first follow up appointment.  Tabitha Spence will receive DVT prophylaxis based on other medications, activity level, and risk  ratio of bleeding to thrombosis.  Mayra Reel, MD [facility] [phone] 12:46 PM

## 2018-07-28 ENCOUNTER — Encounter (HOSPITAL_BASED_OUTPATIENT_CLINIC_OR_DEPARTMENT_OTHER): Payer: Self-pay | Admitting: Orthopaedic Surgery

## 2018-08-06 ENCOUNTER — Telehealth (INDEPENDENT_AMBULATORY_CARE_PROVIDER_SITE_OTHER): Payer: Self-pay

## 2018-08-06 NOTE — Telephone Encounter (Signed)
Called patient and asked the screening questions.  Do you have now or have you had in the past 7 days a fever and/or chills? NO  Do you have now or have you had in the past 7 days a cough? NO  Do you have now or have yo0u had in the last 7 days nausea, vomiting or abdominal pain? NO  Have you been exposed to anyone who has tested positive for COVID-19? NO  Have you or anyone who lives with you traveled within the last month? NO 

## 2018-08-07 ENCOUNTER — Ambulatory Visit (INDEPENDENT_AMBULATORY_CARE_PROVIDER_SITE_OTHER): Payer: PRIVATE HEALTH INSURANCE | Admitting: Physician Assistant

## 2018-08-07 ENCOUNTER — Other Ambulatory Visit: Payer: Self-pay

## 2018-08-07 ENCOUNTER — Ambulatory Visit (INDEPENDENT_AMBULATORY_CARE_PROVIDER_SITE_OTHER): Payer: PRIVATE HEALTH INSURANCE

## 2018-08-07 ENCOUNTER — Encounter (INDEPENDENT_AMBULATORY_CARE_PROVIDER_SITE_OTHER): Payer: Self-pay | Admitting: Orthopaedic Surgery

## 2018-08-07 DIAGNOSIS — M25572 Pain in left ankle and joints of left foot: Secondary | ICD-10-CM | POA: Insufficient documentation

## 2018-08-07 NOTE — Progress Notes (Signed)
Post-Op Visit Note   Patient: Tabitha Spence           Date of Birth: 1975-06-12           MRN: [id number] Visit Date: 08/07/2018 PCP: Eustaquio Boyden, MD   Assessment & Plan:  Chief Complaint:  Chief Complaint  Patient presents with  . Left Ankle - Pain   Visit Diagnoses:  1. Pain in left ankle and joints of left foot     Plan: Patient is a pleasant 43 year old female who presents our clinic today 14 days status post ORIF left lateral malleolus fracture, date of surgery 07/24/2018.  She has been compliant nonweightbearing.  He has been elevating for pain and swelling.  Taking Tylenol as needed.  No fevers or chills.  Examination of her left ankle reveals a well-healing surgical incision with nylon sutures in place.  Mild to moderate swelling to the left foot.  Calf is soft nontender.  She is neurovascularly intact distally.  Today, we will remove the nylon sutures.  We will transition the patient into a cam walker nonweightbearing for the next 4 weeks.  Follow-up with [place] at that time for repeat three-view x-rays of the left ankle.  Call with concerns or questions in the meantime.  Follow-Up Instructions: Return in about 4 weeks (around 09/04/2018).   Orders:  Orders Placed This Encounter  Procedures  . XR Ankle Complete Left   No orders of the defined types were placed in this encounter.   Imaging: Xr Ankle Complete Left  Result Date: 08/07/2018 X-rays demonstrate stable alignment of the hardware and fracture    PMFS History: Patient Active Problem List   Diagnosis Date Noted  . Pain in left ankle and joints of left foot 08/07/2018  . Displaced fracture of distal end of fibula 07/18/2018  . Knee crepitus 10/23/2017  . Health maintenance examination 09/03/2013  . Obesity, Class I, BMI 30.0-34.9 (see actual BMI) 03/06/2013  . Hypothyroidism 03/06/2013  . Asthma   . Perennial allergic rhinitis    Past Medical History:  Diagnosis Date  . Asthma   . Frequent  headaches   . History of chicken pox   . Hypothyroidism   . Obesity 03/06/2013  . Perennial allergic rhinitis     Family History  Problem Relation Age of Onset  . Cancer Paternal Grandmother        breast  . Cancer Maternal Grandfather        blood  . Hypertension Mother   . Arthritis Maternal Grandmother   . Stroke Maternal Grandmother 90  . CAD Neg Hx   . Diabetes Neg Hx     Past Surgical History:  Procedure Laterality Date  . APPENDECTOMY  1999  . ORIF ANKLE FRACTURE Left 07/24/2018   Procedure: OPEN REDUCTION INTERNAL FIXATION (ORIF) LEFT LATERAL MALLEOLUS;  Surgeon: Tarry Kos, MD;  Location: [facility];  Service: Orthopedics;  Laterality: Left;   Social History   Occupational History  . Occupation: Asst Interior and spatial designer at [employer]    Employer: [employer]  Tobacco Use  . Smoking status: Former Smoker    Packs/day: 1.00    Years: 8.00    Pack years: 8.00    Types: Cigarettes    Last attempt to quit: 05/08/1999    Years since quitting: 19.2  . Smokeless tobacco: Never Used  Substance and Sexual Activity  . Alcohol use: No  . Drug use: No  . Sexual activity: Not on file

## 2018-08-15 ENCOUNTER — Encounter (INDEPENDENT_AMBULATORY_CARE_PROVIDER_SITE_OTHER): Payer: Self-pay | Admitting: Orthopaedic Surgery

## 2018-09-04 ENCOUNTER — Ambulatory Visit (INDEPENDENT_AMBULATORY_CARE_PROVIDER_SITE_OTHER): Payer: PRIVATE HEALTH INSURANCE

## 2018-09-04 ENCOUNTER — Encounter (INDEPENDENT_AMBULATORY_CARE_PROVIDER_SITE_OTHER): Payer: Self-pay | Admitting: Orthopaedic Surgery

## 2018-09-04 ENCOUNTER — Other Ambulatory Visit: Payer: Self-pay

## 2018-09-04 ENCOUNTER — Ambulatory Visit (INDEPENDENT_AMBULATORY_CARE_PROVIDER_SITE_OTHER): Payer: PRIVATE HEALTH INSURANCE | Admitting: Orthopaedic Surgery

## 2018-09-04 DIAGNOSIS — S82832A Other fracture of upper and lower end of left fibula, initial encounter for closed fracture: Secondary | ICD-10-CM | POA: Diagnosis not present

## 2018-09-04 DIAGNOSIS — S82839A Other fracture of upper and lower end of unspecified fibula, initial encounter for closed fracture: Secondary | ICD-10-CM

## 2018-09-04 NOTE — Progress Notes (Signed)
   Post-Op Visit Note   Patient: Tabitha Spence           Date of Birth: Dec 28, 1975           MRN: [id number] Visit Date: 09/04/2018 PCP: Eustaquio Boyden, MD   Assessment & Plan:  Chief Complaint:  Chief Complaint  Patient presents with  . Left Ankle - Follow-up   Visit Diagnoses:  1. Displaced fracture of distal end of fibula     Plan: Demetri is 6-week status post ORIF left lateral malleolus fracture.  She is doing well overall.  She has some increased swelling at the end of the day but otherwise she has no real complaints.  Her surgical scar is fully healed.  There is no evidence of infection.  She has some mild swelling.  Her x-rays demonstrate progressive healing of the fracture and stable plate fixation.  At this point we will advance her to weight-bear as tolerated and I gave her a prescription for outpatient physical therapy.  She may wean the boot as tolerated.  Follow-up in 6 weeks with three-view x-rays of the left ankle.  Follow-Up Instructions: Return in about 6 weeks (around 10/16/2018).   Orders:  Orders Placed This Encounter  Procedures  . XR Ankle Complete Left   No orders of the defined types were placed in this encounter.   Imaging: Xr Ankle Complete Left  Result Date: 09/04/2018 Stable plate fixation of lateral malleolus fracture.  Fracture demonstrates healing and bony consolidation.   PMFS History: Patient Active Problem List   Diagnosis Date Noted  . Pain in left ankle and joints of left foot 08/07/2018  . Displaced fracture of distal end of fibula 07/18/2018  . Knee crepitus 10/23/2017  . Health maintenance examination 09/03/2013  . Obesity, Class I, BMI 30.0-34.9 (see actual BMI) 03/06/2013  . Hypothyroidism 03/06/2013  . Asthma   . Perennial allergic rhinitis    Past Medical History:  Diagnosis Date  . Asthma   . Frequent headaches   . History of chicken pox   . Hypothyroidism   . Obesity 03/06/2013  . Perennial allergic rhinitis      Family History  Problem Relation Age of Onset  . Cancer Paternal Grandmother        breast  . Cancer Maternal Grandfather        blood  . Hypertension Mother   . Arthritis Maternal Grandmother   . Stroke Maternal Grandmother 90  . CAD Neg Hx   . Diabetes Neg Hx     Past Surgical History:  Procedure Laterality Date  . APPENDECTOMY  1999  . ORIF ANKLE FRACTURE Left 07/24/2018   Procedure: OPEN REDUCTION INTERNAL FIXATION (ORIF) LEFT LATERAL MALLEOLUS;  Surgeon: Tarry Kos, MD;  Location: [facility];  Service: Orthopedics;  Laterality: Left;   Social History   Occupational History  . Occupation: Asst Interior and spatial designer at [employer]    Employer: [employer]  Tobacco Use  . Smoking status: Former Smoker    Packs/day: 1.00    Years: 8.00    Pack years: 8.00    Types: Cigarettes    Last attempt to quit: 05/08/1999    Years since quitting: 19.3  . Smokeless tobacco: Never Used  Substance and Sexual Activity  . Alcohol use: No  . Drug use: No  . Sexual activity: Not on file

## 2018-09-10 ENCOUNTER — Telehealth: Payer: Self-pay | Admitting: Orthopaedic Surgery

## 2018-09-10 NOTE — Telephone Encounter (Signed)
09/04/2018 ov note & demographics faxed to [facility] [phone] for pts appt today 5/6

## 2018-10-16 ENCOUNTER — Encounter: Payer: Self-pay | Admitting: Orthopaedic Surgery

## 2018-10-16 ENCOUNTER — Ambulatory Visit (INDEPENDENT_AMBULATORY_CARE_PROVIDER_SITE_OTHER): Payer: PRIVATE HEALTH INSURANCE | Admitting: Orthopaedic Surgery

## 2018-10-16 ENCOUNTER — Other Ambulatory Visit: Payer: Self-pay

## 2018-10-16 ENCOUNTER — Ambulatory Visit (INDEPENDENT_AMBULATORY_CARE_PROVIDER_SITE_OTHER): Payer: PRIVATE HEALTH INSURANCE

## 2018-10-16 DIAGNOSIS — S82839A Other fracture of upper and lower end of unspecified fibula, initial encounter for closed fracture: Secondary | ICD-10-CM

## 2018-10-16 DIAGNOSIS — S82832A Other fracture of upper and lower end of left fibula, initial encounter for closed fracture: Secondary | ICD-10-CM | POA: Diagnosis not present

## 2018-10-16 NOTE — Progress Notes (Signed)
   Post-Op Visit Note   Patient: Tabitha Spence           Date of Birth: 15-Jul-1975           MRN: [id number] Visit Date: 10/16/2018 PCP: Ria Bush, MD   Assessment & Plan:  Chief Complaint:  Chief Complaint  Patient presents with  . Left Ankle - Pain, Follow-up   Visit Diagnoses:  1. Displaced fracture of distal end of fibula     Plan: Tabitha Spence is nearly 61-month status post ORIF lateral malleolus fracture.  She is doing well.  She has no complaints.  Occasionally she will have some discomfort at night.  She has resumed all normal activities.  Surgical scar is fully healed.  Full range of motion of the ankle.  At this point patient has recovered quite well from her recent ankle surgery.  She is released to full activity.  Follow-up as needed.  Follow-Up Instructions: No follow-ups on file.   Orders:  Orders Placed This Encounter  Procedures  . XR Ankle Complete Left   No orders of the defined types were placed in this encounter.   Imaging: Xr Ankle Complete Left  Result Date: 10/16/2018 Healed lateral malleolus fracture without complication of the hardware.   PMFS History: Patient Active Problem List   Diagnosis Date Noted  . Pain in left ankle and joints of left foot 08/07/2018  . Displaced fracture of distal end of fibula 07/18/2018  . Knee crepitus 10/23/2017  . Health maintenance examination 09/03/2013  . Obesity, Class I, BMI 30.0-34.9 (see actual BMI) 03/06/2013  . Hypothyroidism 03/06/2013  . Asthma   . Perennial allergic rhinitis    Past Medical History:  Diagnosis Date  . Asthma   . Frequent headaches   . History of chicken pox   . Hypothyroidism   . Obesity 03/06/2013  . Perennial allergic rhinitis     Family History  Problem Relation Age of Onset  . Cancer Paternal Grandmother        breast  . Cancer Maternal Grandfather        blood  . Hypertension Mother   . Arthritis Maternal Grandmother   . Stroke Maternal Grandmother 90  . CAD Neg  Hx   . Diabetes Neg Hx     Past Surgical History:  Procedure Laterality Date  . APPENDECTOMY  1999  . ORIF ANKLE FRACTURE Left 07/24/2018   Procedure: OPEN REDUCTION INTERNAL FIXATION (ORIF) LEFT LATERAL MALLEOLUS;  Surgeon: Leandrew Koyanagi, MD;  Location: [place];  Service: Orthopedics;  Laterality: Left;   Social History   Occupational History  . Occupation: Asst Mudlogger at [employer]    Employer: [employer]  Tobacco Use  . Smoking status: Former Smoker    Packs/day: 1.00    Years: 8.00    Pack years: 8.00    Types: Cigarettes    Quit date: 05/08/1999    Years since quitting: 19.4  . Smokeless tobacco: Never Used  Substance and Sexual Activity  . Alcohol use: No  . Drug use: No  . Sexual activity: Not on file

## 2018-10-23 ENCOUNTER — Telehealth: Payer: Self-pay | Admitting: Family Medicine

## 2018-10-23 ENCOUNTER — Encounter: Payer: Self-pay | Admitting: Family Medicine

## 2018-10-23 NOTE — Telephone Encounter (Signed)
error 

## 2018-10-24 MED ORDER — LEVOTHYROXINE SODIUM 112 MCG PO TABS: 112.0000 ug | ORAL_TABLET | Freq: Every day | ORAL | 0 refills | Status: DC

## 2018-10-24 MED ORDER — ALBUTEROL SULFATE HFA 108 (90 BASE) MCG/ACT IN AERS: 1.0000 | INHALATION_SPRAY | Freq: Four times a day (QID) | RESPIRATORY_TRACT | 3 refills | Status: DC | PRN

## 2018-10-24 NOTE — Telephone Encounter (Signed)
E-scribed refills.  Notified pt via [place].  Placed [facility] rx app for Symbicort in Dr. Synthia Innocent box.

## 2018-10-24 NOTE — Telephone Encounter (Signed)
Filled and in Lisa's box 

## 2018-11-13 ENCOUNTER — Other Ambulatory Visit: Payer: Self-pay | Admitting: Family Medicine

## 2019-01-28 ENCOUNTER — Telehealth: Payer: Self-pay

## 2019-01-28 NOTE — Telephone Encounter (Signed)
Left message to call clinic, needs COVID screen and back door lab info and front door info   

## 2019-02-01 ENCOUNTER — Other Ambulatory Visit: Payer: Self-pay | Admitting: Family Medicine

## 2019-02-01 ENCOUNTER — Encounter: Payer: Self-pay | Admitting: Family Medicine

## 2019-02-01 DIAGNOSIS — E039 Hypothyroidism, unspecified: Secondary | ICD-10-CM

## 2019-02-01 DIAGNOSIS — E669 Obesity, unspecified: Secondary | ICD-10-CM

## 2019-02-02 ENCOUNTER — Other Ambulatory Visit: Payer: PRIVATE HEALTH INSURANCE

## 2019-02-02 NOTE — Telephone Encounter (Signed)
[place], can you get in touch with patient please.

## 2019-02-02 NOTE — Telephone Encounter (Signed)
Pt is rescheduled for 02/25/19 CPE and 02/18/19 labs

## 2019-02-04 ENCOUNTER — Encounter: Payer: PRIVATE HEALTH INSURANCE | Admitting: Family Medicine

## 2019-02-18 ENCOUNTER — Other Ambulatory Visit (INDEPENDENT_AMBULATORY_CARE_PROVIDER_SITE_OTHER): Payer: PRIVATE HEALTH INSURANCE

## 2019-02-18 DIAGNOSIS — E669 Obesity, unspecified: Secondary | ICD-10-CM | POA: Diagnosis not present

## 2019-02-18 DIAGNOSIS — E039 Hypothyroidism, unspecified: Secondary | ICD-10-CM

## 2019-02-18 LAB — BASIC METABOLIC PANEL
BUN: 12 mg/dL (ref 6–23)
CO2: 25 mEq/L (ref 19–32)
Calcium: 9.3 mg/dL (ref 8.4–10.5)
Chloride: 104 mEq/L (ref 96–112)
Creatinine, Ser: 0.87 mg/dL (ref 0.40–1.20)
GFR: 70.62 mL/min (ref >60.00)
Glucose, Bld: 89 mg/dL (ref 70–99)
Potassium: 4.2 mEq/L (ref 3.5–5.1)
Sodium: 138 mEq/L (ref 135–145)

## 2019-02-18 LAB — LDL CHOLESTEROL, DIRECT: Direct LDL: 126 mg/dL

## 2019-02-18 LAB — LIPID PANEL
Cholesterol: 219 mg/dL — ABNORMAL HIGH (ref 0–200)
HDL: 63.6 mg/dL (ref >39.00)
NonHDL: 154.94
Total CHOL/HDL Ratio: 3
Triglycerides: 237 mg/dL — ABNORMAL HIGH (ref 0.0–149.0)
VLDL: 47.4 mg/dL — ABNORMAL HIGH (ref 0.0–40.0)

## 2019-02-18 LAB — TSH: TSH: 7.12 u[IU]/mL — ABNORMAL HIGH (ref 0.35–4.50)

## 2019-02-19 ENCOUNTER — Other Ambulatory Visit (INDEPENDENT_AMBULATORY_CARE_PROVIDER_SITE_OTHER): Payer: PRIVATE HEALTH INSURANCE

## 2019-02-19 DIAGNOSIS — E039 Hypothyroidism, unspecified: Secondary | ICD-10-CM

## 2019-02-19 LAB — T4, FREE: Free T4: 0.74 ng/dL (ref 0.60–1.60)

## 2019-02-20 ENCOUNTER — Other Ambulatory Visit: Payer: Self-pay | Admitting: Family Medicine

## 2019-02-25 ENCOUNTER — Other Ambulatory Visit: Payer: Self-pay

## 2019-02-25 ENCOUNTER — Ambulatory Visit (INDEPENDENT_AMBULATORY_CARE_PROVIDER_SITE_OTHER): Payer: PRIVATE HEALTH INSURANCE | Admitting: Family Medicine

## 2019-02-25 ENCOUNTER — Encounter: Payer: Self-pay | Admitting: Family Medicine

## 2019-02-25 VITALS — BP 120/76 | HR 68 | Temp 97.6°F | Ht 66.5 in | Wt 233.4 lb

## 2019-02-25 DIAGNOSIS — Z0000 Encounter for general adult medical examination without abnormal findings: Secondary | ICD-10-CM | POA: Diagnosis not present

## 2019-02-25 DIAGNOSIS — J4520 Mild intermittent asthma, uncomplicated: Secondary | ICD-10-CM | POA: Diagnosis not present

## 2019-02-25 DIAGNOSIS — Z23 Encounter for immunization: Secondary | ICD-10-CM | POA: Diagnosis not present

## 2019-02-25 DIAGNOSIS — E785 Hyperlipidemia, unspecified: Secondary | ICD-10-CM

## 2019-02-25 DIAGNOSIS — E039 Hypothyroidism, unspecified: Secondary | ICD-10-CM

## 2019-02-25 DIAGNOSIS — S82839A Other fracture of upper and lower end of unspecified fibula, initial encounter for closed fracture: Secondary | ICD-10-CM | POA: Diagnosis not present

## 2019-02-25 MED ORDER — ALBUTEROL SULFATE HFA 108 (90 BASE) MCG/ACT IN AERS: 1.0000 | INHALATION_SPRAY | Freq: Four times a day (QID) | RESPIRATORY_TRACT | 3 refills | Status: DC | PRN

## 2019-02-25 MED ORDER — LEVOTHYROXINE SODIUM 112 MCG PO TABS: 112.0000 ug | ORAL_TABLET | Freq: Every day | ORAL | 3 refills | Status: DC

## 2019-02-25 NOTE — Assessment & Plan Note (Signed)
[employer] application form filled out for patient.  Chronic, stable on daily symbicort.  Discussed avoiding use of carpet cleaner and wear mask, but if necessary trial albuterol prior to use.

## 2019-02-25 NOTE — Assessment & Plan Note (Signed)
Preventative protocols reviewed and updated unless pt declined. Discussed healthy diet and lifestyle.  

## 2019-02-25 NOTE — Patient Instructions (Addendum)
Flu shot today Return in 6 weeks for lab visit only to recheck thyroid levels. If staying low, we will increase thyroid dose. Possibly due to taking with multivitamin.  You are doing well today Return as needed or in 1 year for next physical.   Health Maintenance, Female Adopting a healthy lifestyle and getting preventive care are important in promoting health and wellness. Ask your health care provider about:  The right schedule for you to have regular tests and exams.  Things you can do on your own to prevent diseases and keep yourself healthy. What should I know about diet, weight, and exercise? Eat a healthy diet   Eat a diet that includes plenty of vegetables, fruits, low-fat dairy products, and lean protein.  Do not eat a lot of foods that are high in solid fats, added sugars, or sodium. Maintain a healthy weight Body mass index (BMI) is used to identify weight problems. It estimates body fat based on height and weight. Your health care provider can help determine your BMI and help you achieve or maintain a healthy weight. Get regular exercise Get regular exercise. This is one of the most important things you can do for your health. Most adults should:  Exercise for at least 150 minutes each week. The exercise should increase your heart rate and make you sweat (moderate-intensity exercise).  Do strengthening exercises at least twice a week. This is in addition to the moderate-intensity exercise.  Spend less time sitting. Even light physical activity can be beneficial. Watch cholesterol and blood lipids Have your blood tested for lipids and cholesterol at 44 years of age, then have this test every 5 years. Have your cholesterol levels checked more often if:  Your lipid or cholesterol levels are high.  You are older than 44 years of age.  You are at high risk for heart disease. What should I know about cancer screening? Depending on your health history and family history,  you may need to have cancer screening at various ages. This may include screening for:  Breast cancer.  Cervical cancer.  Colorectal cancer.  Skin cancer.  Lung cancer. What should I know about heart disease, diabetes, and high blood pressure? Blood pressure and heart disease  High blood pressure causes heart disease and increases the risk of stroke. This is more likely to develop in people who have high blood pressure readings, are of African descent, or are overweight.  Have your blood pressure checked: ? Every 3-5 years if you are 39-42 years of age. ? Every year if you are 52 years old or older. Diabetes Have regular diabetes screenings. This checks your fasting blood sugar level. Have the screening done:  Once every three years after age 27 if you are at a normal weight and have a low risk for diabetes.  More often and at a younger age if you are overweight or have a high risk for diabetes. What should I know about preventing infection? Hepatitis B If you have a higher risk for hepatitis B, you should be screened for this virus. Talk with your health care provider to find out if you are at risk for hepatitis B infection. Hepatitis C Testing is recommended for:  Everyone born from [birth year] through 1966.  Anyone with known risk factors for hepatitis C. Sexually transmitted infections (STIs)  Get screened for STIs, including gonorrhea and chlamydia, if: ? You are sexually active and are younger than 44 years of age. ? You are older than 44  years of age and your health care provider tells you that you are at risk for this type of infection. ? Your sexual activity has changed since you were last screened, and you are at increased risk for chlamydia or gonorrhea. Ask your health care provider if you are at risk.  Ask your health care provider about whether you are at high risk for HIV. Your health care provider may recommend a prescription medicine to help prevent HIV infection.  If you choose to take medicine to prevent HIV, you should first get tested for HIV. You should then be tested every 3 months for as long as you are taking the medicine. Pregnancy  If you are about to stop having your period (premenopausal) and you may become pregnant, seek counseling before you get pregnant.  Take 400 to 800 micrograms (mcg) of folic acid every day if you become pregnant.  Ask for birth control (contraception) if you want to prevent pregnancy. Osteoporosis and menopause Osteoporosis is a disease in which the bones lose minerals and strength with aging. This can result in bone fractures. If you are 55 years old or older, or if you are at risk for osteoporosis and fractures, ask your health care provider if you should:  Be screened for bone loss.  Take a calcium or vitamin D supplement to lower your risk of fractures.  Be given hormone replacement therapy (HRT) to treat symptoms of menopause. Follow these instructions at home: Lifestyle  Do not use any products that contain nicotine or tobacco, such as cigarettes, e-cigarettes, and chewing tobacco. If you need help quitting, ask your health care provider.  Do not use street drugs.  Do not share needles.  Ask your health care provider for help if you need support or information about quitting drugs. Alcohol use  Do not drink alcohol if: ? Your health care provider tells you not to drink. ? You are pregnant, may be pregnant, or are planning to become pregnant.  If you drink alcohol: ? Limit how much you use to 0-1 drink a day. ? Limit intake if you are breastfeeding.  Be aware of how much alcohol is in your drink. In the [place] one drink equals one 12 oz bottle of beer (355 mL), one 5 oz glass of wine (148 mL), or one 1 oz glass of hard liquor (44 mL). General instructions  Schedule regular health, dental, and eye exams.  Stay current with your vaccines.  Tell your health care provider if: ? You often feel  depressed. ? You have ever been abused or do not feel safe at home. Summary  Adopting a healthy lifestyle and getting preventive care are important in promoting health and wellness.  Follow your health care provider's instructions about healthy diet, exercising, and getting tested or screened for diseases.  Follow your health care provider's instructions on monitoring your cholesterol and blood pressure. This information is not intended to replace advice given to you by your health care provider. Make sure you discuss any questions you have with your health care provider. Document Released: 11/06/2010 Document Revised: 04/16/2018 Document Reviewed: 04/16/2018 Elsevier Patient Education  2020 [employer].

## 2019-02-25 NOTE — Assessment & Plan Note (Signed)
Deteriorated lipid levels. Reviewed healthy diet choices to improve triglycerides. The 10-year ASCVD risk score Tabitha Spence., et al., 2013) is: 0.6%   Values used to calculate the score:     Age: 43 years     Sex: Female     Is Non-Hispanic African American: No     Diabetic: No     Tobacco smoker: No     Systolic Blood Pressure: 850 mmHg     Is BP treated: No     HDL Cholesterol: 63.6 mg/dL     Total Cholesterol: 219 mg/dL

## 2019-02-25 NOTE — Assessment & Plan Note (Addendum)
Weight gain noted, discussed. Encouraged renewed efforts at healthy diet and lifestyle to achieve sustainable weight loss.

## 2019-02-25 NOTE — Assessment & Plan Note (Addendum)
Healing well from this s/p ORIF in the summer.

## 2019-02-25 NOTE — Assessment & Plan Note (Signed)
TSH elevated - she was taking with iron-containing MVI - has changed MVI to night time dosing. Will recheck TFTs in 6 wks and determine need for med change at that time.

## 2019-02-25 NOTE — Progress Notes (Signed)
This visit was conducted in person.  BP 120/76 (BP Location: Left Arm, Patient Position: Sitting, Cuff Size: Large)    Pulse 68    Temp 97.6 F (36.4 C) (Temporal)    Ht 5' 6.5" (1.689 m)    Wt 233 lb 6 oz (105.9 kg)    LMP 02/04/2019    SpO2 96%    BMI 37.10 kg/m    CC: CPE Subjective:    Patient ID: Tabitha Spence, female    DOB: 11/24/1975, 43 y.o.   MRN: [id number]  HPI: Tabitha Spence is a 43 y.o. female presenting on 02/25/2019 for Annual Exam (Provided 'Application for [employer]' meds [Symbicort] to be completed. 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   L fib fracture s/p ORIF over summer - tripped over dog and fell down stairs.   [facility]ative[facility] next [facility]. Pap smearWNL.  Mammogram 03/2018 normal per patient Flu shot yearly Tdap 2014 Seat belt use discussed Sunscreen use discussed. No changing moles. h/o BCC on left nose. Sees derm yearly.  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 Interim medical history since our last visit reviewed. Allergies and medications reviewed and updated. Outpatient Medications Prior to Visit  Medication Sig Dispense Refill   budesonide-formoterol (SYMBICORT) 80-4.5 MCG/ACT inhaler Inhale 1 puff into the lungs 2 (two) times daily. 1 Inhaler 11   levocetirizine (XYZAL ALLERGY 24HR) 5 MG tablet      Multiple Vitamins-Minerals (MULTIVITAMIN ADULTS PO) Take by mouth daily.     norgestimate-ethinyl estradiol (SPRINTEC 28)  0.25-35 MG-MCG tablet Take 1 tablet by mouth daily.     albuterol (VENTOLIN HFA) 108 (90 Base) MCG/ACT inhaler Inhale 1-2 puffs into the lungs every 6 (six) hours as needed. 8.5 g 3   levothyroxine (SYNTHROID) 112 MCG tablet TAKE 1 TABLET BY MOUTH EVERY DAY 30 tablet 0   acetaminophen (TYLENOL) 500 MG tablet Take 1,000 mg by mouth every 6 (six) hours as needed.     aspirin EC 81 MG tablet Take 1 tablet (81 mg total) by mouth 2 (two) times daily. 84 tablet 0   calcium-vitamin D (OSCAL WITH D) 500-200 MG-UNIT tablet Take 1 tablet by mouth 3 (three) times daily. 90 tablet 6   HYDROcodone-acetaminophen (NORCO/VICODIN) 5-325 MG tablet Take 2 tablets by mouth every 4 (four) hours as needed. 16 tablet 0   ondansetron (ZOFRAN) 4 MG tablet Take 1 tablet (4 mg total) by mouth every 8 (eight) hours as needed for nausea or vomiting. 20 tablet 0   oxyCODONE (OXY IR/ROXICODONE) 5 MG immediate release tablet Take 1-2 tablets (5-10 mg total) by mouth 3 (three) times daily as needed. 30 tablet 0   traMADol (ULTRAM) 50 MG tablet Take 1-2 tablets (50-100 mg total) by mouth 4 (four) times daily as needed. 30 tablet 0   zinc sulfate 220 (50 Zn) MG capsule Take 1 capsule (220 mg total) by mouth daily. 42 capsule 0   No facility-administered medications prior to visit.  Per HPI unless specifically indicated in ROS section below Review of Systems  Constitutional: Negative for activity change, appetite change, chills, fatigue, fever and unexpected weight change.  HENT: Negative for hearing loss.   Eyes: Negative for visual disturbance.  Respiratory: Positive for chest tightness, shortness of breath and wheezing. Negative for cough.        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  Gastrointestinal: Negative for abdominal distention, abdominal pain, blood in stool, constipation, diarrhea, nausea and vomiting.  Genitourinary: Negative for  difficulty urinating and hematuria.  Musculoskeletal: Negative for arthralgias, myalgias and neck pain.  Skin: Negative for rash.  Neurological: Negative for dizziness, seizures, syncope and headaches.  Hematological: Negative for adenopathy. Does not bruise/bleed easily.  Psychiatric/Behavioral: Negative for dysphoric mood. The patient is not nervous/anxious.    Objective:    BP 120/76 (BP Location: Left Arm, Patient Position: Sitting, Cuff Size: Large)    Pulse 68    Temp 97.6 F (36.4 C) (Temporal)    Ht 5' 6.5" (1.689 m)    Wt 233 lb 6 oz (105.9 kg)    LMP 02/04/2019    SpO2 96%    BMI 37.10 kg/m   Wt Readings from Last 3 Encounters:  02/25/19 233 lb 6 oz (105.9 kg)  07/24/18 220 lb 7.4 oz (100 kg)  07/16/18 220 lb (99.8 kg)    Physical Exam Vitals signs and nursing note reviewed.  Constitutional:      General: She is not in acute distress.    Appearance: Normal appearance. She is well-developed. She is obese. She is not ill-appearing.  HENT:     Head: Normocephalic and atraumatic.     Right Ear: Hearing, tympanic membrane, ear canal and external ear normal.     Left Ear: Hearing, tympanic membrane, ear canal and external ear normal.     Nose: Nose normal.     Mouth/Throat:     Mouth: Mucous membranes are moist.     Pharynx: Oropharynx is clear. Uvula midline. No posterior oropharyngeal erythema.  Eyes:     General: No scleral icterus.    Conjunctiva/sclera: Conjunctivae normal.     Pupils: Pupils are equal, round, and reactive to light.  Neck:     Musculoskeletal: Normal range of motion and neck supple.  Cardiovascular:     Rate and Rhythm: Normal rate and regular rhythm.     Pulses: Normal pulses.          Radial pulses are 2+ on the right side and 2+ on the left side.     Heart sounds: Normal heart sounds. No murmur.  Pulmonary:     Effort: Pulmonary effort is normal. No respiratory distress.     Breath sounds: Normal breath sounds. No wheezing, rhonchi or rales.    Abdominal:     General: Abdomen is flat. Bowel sounds are normal. There is no distension.     Palpations: Abdomen is soft. There is no mass.     Tenderness: There is no abdominal tenderness. There is no guarding or rebound.     Hernia: No hernia is present.  Musculoskeletal: Normal range of motion.  Lymphadenopathy:     Cervical: No cervical adenopathy.  Skin:    General: Skin is warm and dry.     Capillary Refill: Capillary refill takes less than 2 seconds.     Findings: No rash.  Neurological:     General: No focal deficit present.  Mental Status: She is alert and oriented to person, place, and time.     Comments: CN grossly intact, station and gait intact  Psychiatric:        Mood and Affect: Mood normal.        Behavior: Behavior normal.        Thought Content: Thought content normal.        Judgment: Judgment normal.       Results for orders placed or performed in visit on 02/19/19  T4, free  Result Value Ref Range   Free T4 0.74 0.60 - 1.60 ng/dL   Assessment & Plan:   Problem List Items Addressed This Visit    Severe obesity (BMI 35.0-39.9) with comorbidity (HCC)    Weight gain noted, discussed. Encouraged renewed efforts at healthy diet and lifestyle to achieve sustainable weight loss.       Hypothyroidism    TSH elevated - she was taking with iron-containing MVI - has changed MVI to night time dosing. Will recheck TFTs in 6 wks and determine need for med change at that time.       Relevant Medications   levothyroxine (SYNTHROID) 112 MCG tablet   Other Relevant Orders   TSH   T4, free   Health maintenance examination - Primary    Preventative protocols reviewed and updated unless pt declined. Discussed healthy diet and lifestyle.       Dyslipidemia    Deteriorated lipid levels. Reviewed healthy diet choices to improve triglycerides. The 10-year ASCVD risk score Denman George(Goff [place] Montez HagemanJr., et al., 2013) is: 0.6%   Values used to calculate the score:     Age: [age]  years     Sex: Female     Is Non-Hispanic African American: No     Diabetic: No     Tobacco smoker: No     Systolic Blood Pressure: 120 mmHg     Is BP treated: No     HDL Cholesterol: 63.6 mg/dL     Total Cholesterol: 219 mg/dL       Displaced fracture of distal end of fibula    Healing well from this s/p ORIF in the summer.       Asthma    [employer] application form filled out for patient.  Chronic, stable on daily symbicort.  Discussed avoiding use of carpet cleaner and wear mask, but if necessary trial albuterol prior to use.       Relevant Medications   albuterol (VENTOLIN HFA) 108 (90 Base) MCG/ACT inhaler    Other Visit Diagnoses    Need for influenza vaccination       Relevant Orders   Flu Vaccine QUAD 36+ mos IM (Completed)       Meds ordered this encounter  Medications   albuterol (VENTOLIN HFA) 108 (90 Base) MCG/ACT inhaler    Sig: Inhale 1-2 puffs into the lungs every 6 (six) hours as needed.    Dispense:  18 g    Refill:  3   levothyroxine (SYNTHROID) 112 MCG tablet    Sig: Take 1 tablet (112 mcg total) by mouth daily.    Dispense:  90 tablet    Refill:  3   Orders Placed This Encounter  Procedures   Flu Vaccine QUAD 36+ mos IM   TSH    Standing Status:   Future    Standing Expiration Date:   02/25/2020   T4, free    Standing Status:   Future    Standing Expiration  Date:   02/25/2020    Patient instructions: Flu shot today Return in 6 weeks for lab visit only to recheck thyroid levels. If staying low, we will increase thyroid dose. Possibly due to taking with multivitamin.  You are doing well today Return as needed or in 1 year for next physical.   Follow up plan: Return in about 1 year (around 02/25/2020) for annual exam, prior fasting for blood work.  Eustaquio Boyden, MD

## 2019-04-08 ENCOUNTER — Other Ambulatory Visit: Payer: Self-pay

## 2019-04-08 ENCOUNTER — Other Ambulatory Visit (INDEPENDENT_AMBULATORY_CARE_PROVIDER_SITE_OTHER): Payer: PRIVATE HEALTH INSURANCE

## 2019-04-08 DIAGNOSIS — E039 Hypothyroidism, unspecified: Secondary | ICD-10-CM

## 2019-04-08 LAB — TSH: TSH: 4.9 u[IU]/mL — ABNORMAL HIGH (ref 0.35–4.50)

## 2019-04-08 LAB — T4, FREE: Free T4: 0.77 ng/dL (ref 0.60–1.60)

## 2019-04-10 ENCOUNTER — Other Ambulatory Visit: Payer: Self-pay | Admitting: Family Medicine

## 2019-04-10 MED ORDER — LEVOTHYROXINE SODIUM 125 MCG PO TABS: 125.0000 ug | ORAL_TABLET | Freq: Every day | ORAL | 1 refills | Status: DC

## 2019-06-22 ENCOUNTER — Other Ambulatory Visit: Payer: Self-pay

## 2019-06-22 ENCOUNTER — Other Ambulatory Visit: Payer: Self-pay | Admitting: Family Medicine

## 2019-06-22 ENCOUNTER — Other Ambulatory Visit (INDEPENDENT_AMBULATORY_CARE_PROVIDER_SITE_OTHER): Payer: BC Managed Care – PPO

## 2019-06-22 DIAGNOSIS — E039 Hypothyroidism, unspecified: Secondary | ICD-10-CM | POA: Diagnosis not present

## 2019-06-22 DIAGNOSIS — E785 Hyperlipidemia, unspecified: Secondary | ICD-10-CM

## 2019-06-22 LAB — TSH: TSH: 2.62 u[IU]/mL (ref 0.35–4.50)

## 2019-06-22 LAB — T4, FREE: Free T4: 0.7 ng/dL (ref 0.60–1.60)

## 2019-06-24 ENCOUNTER — Other Ambulatory Visit: Payer: Self-pay | Admitting: Family Medicine

## 2019-07-07 ENCOUNTER — Ambulatory Visit: Payer: BC Managed Care – PPO | Attending: Internal Medicine

## 2019-07-07 DIAGNOSIS — Z23 Encounter for immunization: Secondary | ICD-10-CM | POA: Insufficient documentation

## 2019-07-07 NOTE — Progress Notes (Signed)
   Covid-19 Vaccination Clinic  Name:  Tabitha Spence    MRN: [id number] DOB: 02/11/1976  07/07/2019  [name] was observed post Covid-19 immunization for 15 minutes without incident. She was provided with Vaccine Information Sheet and instruction to access the V-Safe system.   [name] was instructed to call 911 with any severe reactions post vaccine: Marland Kitchen Difficulty breathing  . Swelling of face and throat  . A fast heartbeat  . A bad rash all over body  . Dizziness and weakness   Immunizations Administered    Name Date Dose VIS Date Route   Moderna COVID-19 Vaccine 07/07/2019 10:40 AM 0.5 mL 04/07/2019 Intramuscular   Manufacturer: Moderna   Lot: [id number]   NDC: 99371-696-78

## 2019-08-04 ENCOUNTER — Ambulatory Visit: Payer: BC Managed Care – PPO | Attending: Internal Medicine

## 2019-08-04 DIAGNOSIS — Z23 Encounter for immunization: Secondary | ICD-10-CM

## 2019-08-04 NOTE — Progress Notes (Signed)
   Covid-19 Vaccination Clinic  Name:  Tabitha Spence    MRN: [id number] DOB: 13-Feb-1976  08/04/2019  [name] was observed post Covid-19 immunization for 15 minutes without incident. She was provided with Vaccine Information Sheet and instruction to access the V-Safe system.   [name] was instructed to call 911 with any severe reactions post vaccine: Marland Kitchen Difficulty breathing  . Swelling of face and throat  . A fast heartbeat  . A bad rash all over body  . Dizziness and weakness   Immunizations Administered    Name Date Dose VIS Date Route   Moderna COVID-19 Vaccine 08/04/2019 10:28 AM 0.5 mL 04/07/2019 Intramuscular   Manufacturer: Moderna   Lot: [id number]   NDC: 92763-943-20

## 2019-09-23 ENCOUNTER — Other Ambulatory Visit: Payer: Self-pay | Admitting: Family Medicine

## 2019-10-13 ENCOUNTER — Other Ambulatory Visit: Payer: Self-pay | Admitting: Family Medicine

## 2019-11-11 IMAGING — DX LEFT ANKLE COMPLETE - 3+ VIEW
3 series · 3 of 3 positions shown · non-contrast
Comparison: None.

CLINICAL DATA: Status post fall down steps, with medial and lateral
malleolar pain, extending up the leg. Initial encounter.

EXAM:
LEFT ANKLE COMPLETE - 3+ VIEW

[ankle ap]
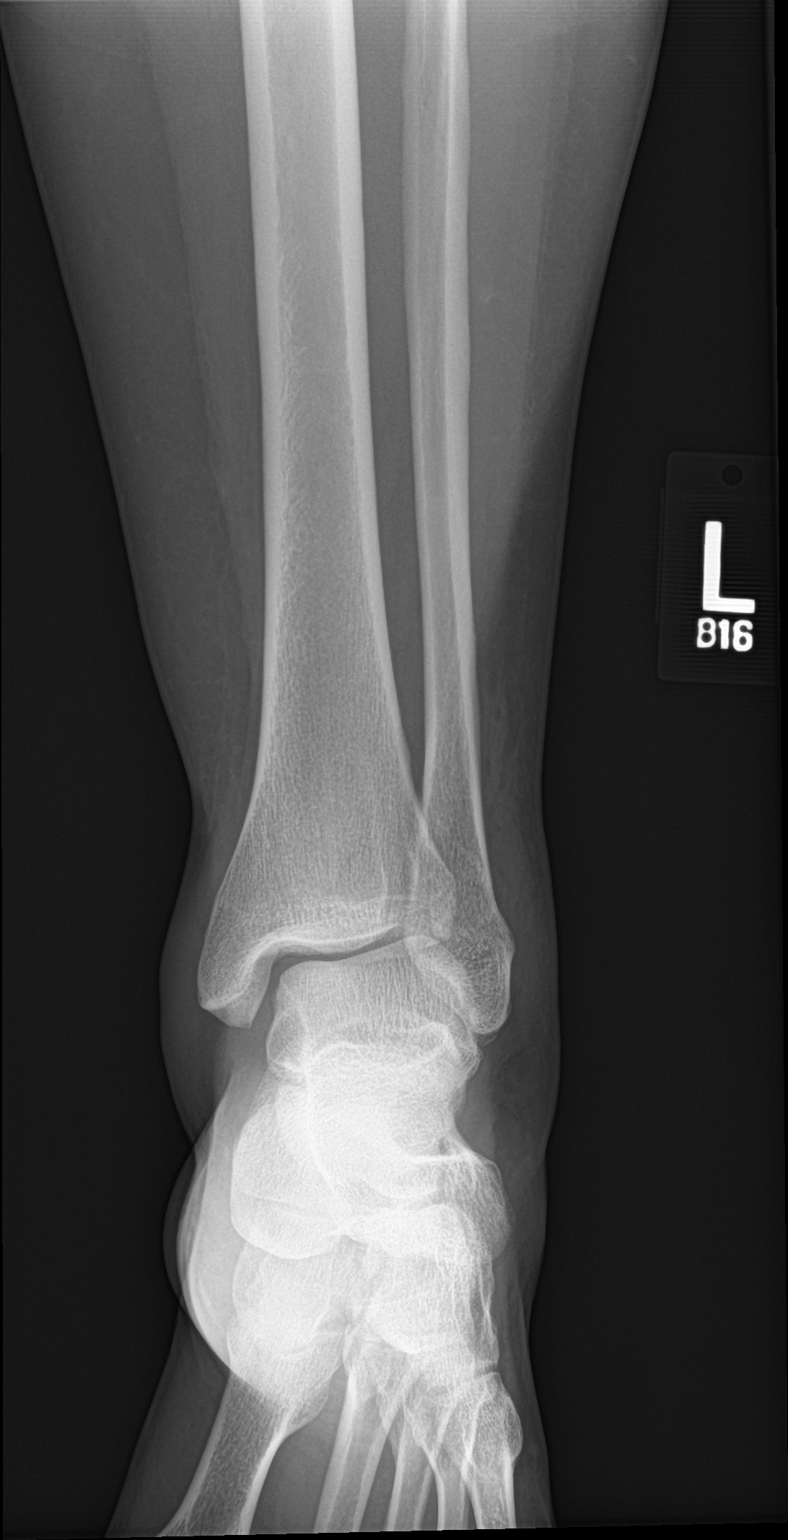

[ankle obl]
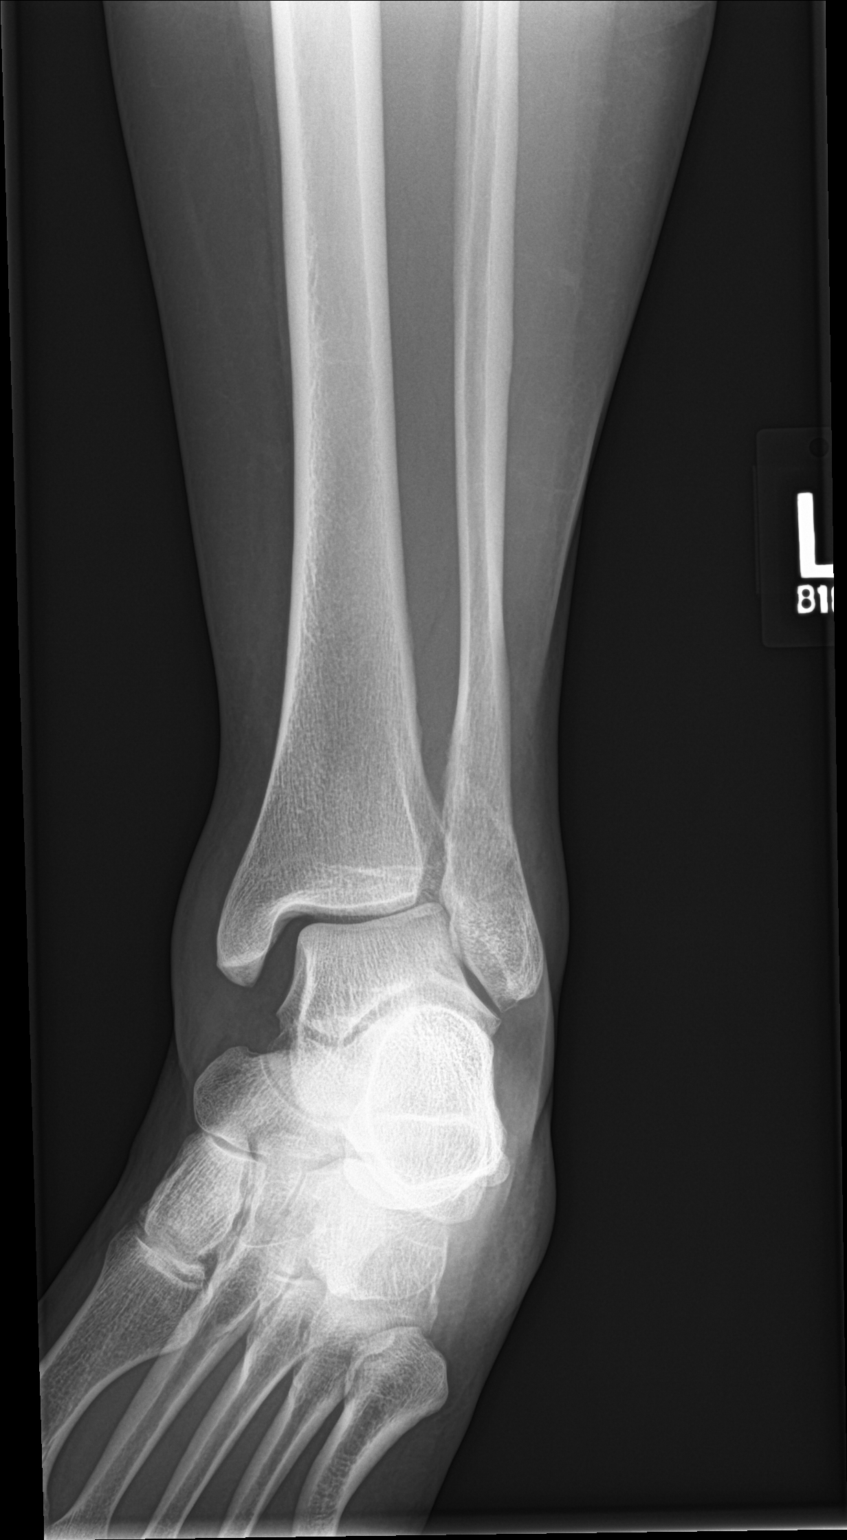

[ankle lat]
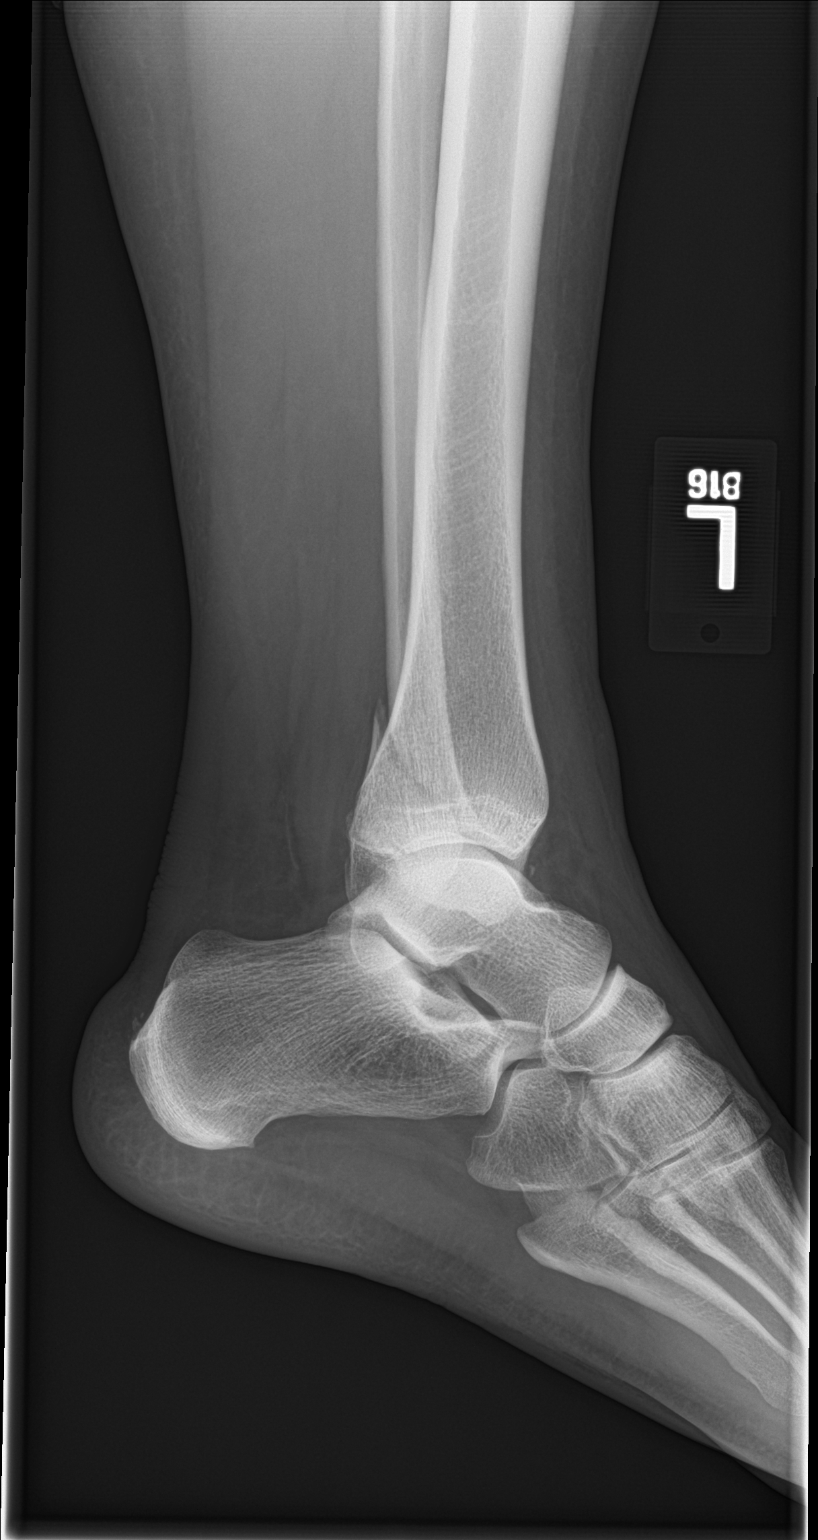

[3 of 3 positions shown; findings below may reference images not displayed]

FINDINGS: There is a mildly displaced oblique fracture through the distal
fibula, with lateral and posterior displacement. There is associated
medial widening of the ankle mortise. Surrounding soft tissue
swelling is noted.

No significant talar subluxation is seen at this time. The
interosseous space is grossly unremarkable.
IMPRESSION: Mildly displaced oblique fracture through the distal fibula, with
lateral and posterior displacement. Associated medial widening of
the ankle mortise.

Radiographs of the proximal fibula could be considered to assess for
associated proximal fibular fracture.

## 2020-01-05 ENCOUNTER — Other Ambulatory Visit: Payer: Self-pay | Admitting: Family Medicine

## 2020-03-08 ENCOUNTER — Other Ambulatory Visit: Payer: Self-pay | Admitting: Family Medicine

## 2020-03-08 NOTE — Telephone Encounter (Signed)
E-scribed refill.  Plz schedule cpe and lab visits for additional refills.  

## 2020-04-04 ENCOUNTER — Other Ambulatory Visit: Payer: Self-pay | Admitting: Family Medicine

## 2020-04-13 DIAGNOSIS — Z01419 Encounter for gynecological examination (general) (routine) without abnormal findings: Secondary | ICD-10-CM | POA: Diagnosis not present

## 2020-04-25 ENCOUNTER — Other Ambulatory Visit: Payer: Self-pay | Admitting: Family Medicine

## 2020-05-14 ENCOUNTER — Other Ambulatory Visit: Payer: Self-pay | Admitting: Family Medicine

## 2020-05-14 DIAGNOSIS — Z1159 Encounter for screening for other viral diseases: Secondary | ICD-10-CM

## 2020-05-14 DIAGNOSIS — E785 Hyperlipidemia, unspecified: Secondary | ICD-10-CM

## 2020-05-14 DIAGNOSIS — E039 Hypothyroidism, unspecified: Secondary | ICD-10-CM

## 2020-05-17 ENCOUNTER — Other Ambulatory Visit: Payer: Self-pay

## 2020-05-17 ENCOUNTER — Other Ambulatory Visit (INDEPENDENT_AMBULATORY_CARE_PROVIDER_SITE_OTHER): Payer: BC Managed Care – PPO

## 2020-05-17 DIAGNOSIS — E785 Hyperlipidemia, unspecified: Secondary | ICD-10-CM | POA: Diagnosis not present

## 2020-05-17 DIAGNOSIS — I10 Essential (primary) hypertension: Secondary | ICD-10-CM | POA: Diagnosis not present

## 2020-05-17 DIAGNOSIS — Z1159 Encounter for screening for other viral diseases: Secondary | ICD-10-CM

## 2020-05-17 DIAGNOSIS — E039 Hypothyroidism, unspecified: Secondary | ICD-10-CM

## 2020-05-17 DIAGNOSIS — Z6838 Body mass index (BMI) 38.0-38.9, adult: Secondary | ICD-10-CM | POA: Diagnosis not present

## 2020-05-17 LAB — COMPREHENSIVE METABOLIC PANEL
ALT: 20 U/L (ref 0–35)
AST: 18 U/L (ref 0–37)
Albumin: 4.2 g/dL (ref 3.5–5.2)
Alkaline Phosphatase: 64 U/L (ref 39–117)
BUN: 15 mg/dL (ref 6–23)
CO2: 25 mEq/L (ref 19–32)
Calcium: 9.5 mg/dL (ref 8.4–10.5)
Chloride: 105 mEq/L (ref 96–112)
Creatinine, Ser: 0.99 mg/dL (ref 0.40–1.20)
GFR: 68.82 mL/min (ref >60.00)
Glucose, Bld: 96 mg/dL (ref 70–99)
Potassium: 4.3 mEq/L (ref 3.5–5.1)
Sodium: 137 mEq/L (ref 135–145)
Total Bilirubin: 0.5 mg/dL (ref 0.2–1.2)
Total Protein: 7.4 g/dL (ref 6.0–8.3)

## 2020-05-17 LAB — LIPID PANEL
Cholesterol: 229 mg/dL — ABNORMAL HIGH (ref 0–200)
HDL: 73.5 mg/dL (ref >39.00)
LDL Cholesterol: 119 mg/dL — ABNORMAL HIGH (ref 0–99)
NonHDL: 155.37
Total CHOL/HDL Ratio: 3
Triglycerides: 184 mg/dL — ABNORMAL HIGH (ref 0.0–149.0)
VLDL: 36.8 mg/dL (ref 0.0–40.0)

## 2020-05-17 LAB — T4, FREE: Free T4: 0.84 ng/dL (ref 0.60–1.60)

## 2020-05-17 LAB — TSH: TSH: 1.5 u[IU]/mL (ref 0.35–4.50)

## 2020-05-18 DIAGNOSIS — Z1231 Encounter for screening mammogram for malignant neoplasm of breast: Secondary | ICD-10-CM | POA: Diagnosis not present

## 2020-05-18 LAB — HM MAMMOGRAPHY

## 2020-05-24 ENCOUNTER — Encounter: Payer: BC Managed Care – PPO | Admitting: Family Medicine

## 2020-05-28 ENCOUNTER — Other Ambulatory Visit: Payer: Self-pay | Admitting: Family Medicine

## 2020-05-31 NOTE — Telephone Encounter (Signed)
Pharmacy requests refill on: Levothyroxine 125 mcg   LAST REFILL: 04/05/2020 (Q-30, R-1) LAST OV: 02/25/2019 NEXT OV: 06/10/2020 PHARMACY: [id number] [place], [place]  TSH (05/17/2020): 1.5

## 2020-06-10 ENCOUNTER — Other Ambulatory Visit: Payer: Self-pay

## 2020-06-10 ENCOUNTER — Encounter: Payer: Self-pay | Admitting: Family Medicine

## 2020-06-10 ENCOUNTER — Ambulatory Visit (INDEPENDENT_AMBULATORY_CARE_PROVIDER_SITE_OTHER): Payer: BC Managed Care – PPO | Admitting: Family Medicine

## 2020-06-10 VITALS — BP 138/84 | HR 102 | Temp 97.6°F | Ht 66.25 in | Wt 236.2 lb

## 2020-06-10 DIAGNOSIS — E785 Hyperlipidemia, unspecified: Secondary | ICD-10-CM

## 2020-06-10 DIAGNOSIS — Z1211 Encounter for screening for malignant neoplasm of colon: Secondary | ICD-10-CM | POA: Diagnosis not present

## 2020-06-10 DIAGNOSIS — Z23 Encounter for immunization: Secondary | ICD-10-CM

## 2020-06-10 DIAGNOSIS — Z0000 Encounter for general adult medical examination without abnormal findings: Secondary | ICD-10-CM | POA: Diagnosis not present

## 2020-06-10 DIAGNOSIS — J3089 Other allergic rhinitis: Secondary | ICD-10-CM

## 2020-06-10 DIAGNOSIS — E039 Hypothyroidism, unspecified: Secondary | ICD-10-CM | POA: Diagnosis not present

## 2020-06-10 DIAGNOSIS — J4530 Mild persistent asthma, uncomplicated: Secondary | ICD-10-CM

## 2020-06-10 MED ORDER — MONTELUKAST SODIUM 10 MG PO TABS: 10.0000 mg | ORAL_TABLET | Freq: Every day | ORAL | 11 refills | Status: DC

## 2020-06-10 MED ORDER — LEVOTHYROXINE SODIUM 125 MCG PO TABS
125.0000 ug | ORAL_TABLET | Freq: Every day | ORAL | 3 refills | Status: DC
Start: 2020-06-10 — End: 2021-07-11

## 2020-06-10 MED ORDER — BUDESONIDE-FORMOTEROL FUMARATE 80-4.5 MCG/ACT IN AERO: 1.0000 | INHALATION_SPRAY | Freq: Two times a day (BID) | RESPIRATORY_TRACT | 3 refills | Status: DC

## 2020-06-10 MED ORDER — FISH OIL 1200 MG PO CAPS: 2.0000 | ORAL_CAPSULE | Freq: Every evening | ORAL | Status: DC

## 2020-06-10 NOTE — Assessment & Plan Note (Signed)
Chronic, stable. Continue levothyroxine 125mcg daily.  

## 2020-06-10 NOTE — Progress Notes (Signed)
Patient ID: Tabitha Spence, female    DOB: 1975-08-06, 45 y.o.   MRN: [id number]  This visit was conducted in person.  BP 138/84 (BP Location: Left Arm, Patient Position: Sitting, Cuff Size: Large)   Pulse (!) 102   Temp 97.6 F (36.4 C) (Temporal)   Ht 5' 6.25" (1.683 m)   Wt 236 lb 4 oz (107.2 kg)   LMP 05/22/2020   SpO2 98%   BMI 37.84 kg/m    CC: CPE Subjective:   HPI: Tabitha Spence is a 45 y.o. female presenting on 06/10/2020 for Annual Exam   Asthma with allergies - takes symbicort 1 puff BID with albuterol once daily. Continues xyzal. Avoids decongestants. Chronic nasal congestion. Asthma triggers are strong smells, sudden changes in temperature, dust, pet hair/dander.   Recent BP elevation through GYN - improving off nuvaring. Already limits salt intake.   New position at work - very sedentary.   Frequent headaches managed with BC powder - stress TTH and congestion pressure HAs.   Preventative: Colon cancer screening - discussed. Would like iFOB[facility] Pap smearWNL. Nuvaring removed due to high blood pressures. Planning to get IUD.  Mammogram 04/2020 normal per patient Flu shotyearly COVID vaccine Moderna 07/2019 x2, booster 03/2020  Tdap 2014 Seat belt use discussed Sunscreen use discussed. No changing moles. h/o BCC on left nose. Sees derm.  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 Interim medical history since our last visit reviewed. Allergies and medications reviewed and updated. Outpatient Medications Prior to Visit  Medication  Sig Dispense Refill  . albuterol (VENTOLIN HFA) 108 (90 Base) MCG/ACT inhaler INHALE 1-2 PUFFS INTO THE LUNGS EVERY 6 (SIX) HOURS AS NEEDED 18 each 1  . levocetirizine (XYZAL) 5 MG tablet     . Multiple Vitamins-Minerals (MULTIVITAMIN ADULTS PO) Take by mouth daily.    . budesonide-formoterol (SYMBICORT) 80-4.5 MCG/ACT inhaler Inhale 1 puff into the lungs 2 (two) times daily. 1 Inhaler 11  . levothyroxine (SYNTHROID) 125 MCG tablet TAKE 1 TABLET BY MOUTH EVERY DAY 30 tablet 0  . norgestimate-ethinyl estradiol (SPRINTEC 28) 0.25-35 MG-MCG tablet Take 1 tablet by mouth daily.    . Omega-3 Fatty Acids (FISH OIL) 1200 MG CAPS      No facility-administered medications prior to visit.     Per HPI unless specifically indicated in ROS section below Review of Systems  Constitutional: Negative for activity change, appetite change, chills, fatigue, fever and unexpected weight change.  HENT: Negative for hearing loss.   Eyes: Negative for visual disturbance.  Respiratory: Negative for cough, chest tightness, shortness of breath and wheezing.   Cardiovascular: Negative for chest pain, palpitations and leg swelling.  Gastrointestinal: Negative for abdominal distention, abdominal pain, blood in stool, constipation, diarrhea, nausea and vomiting.  Genitourinary: Negative for difficulty urinating and hematuria.  Musculoskeletal: Negative for arthralgias, myalgias and neck pain.  Skin: Negative for rash.  Neurological: Positive for headaches (daily - attributes to sinus congestion/pressure, as well as TTH from stress). Negative for dizziness, seizures and syncope.  Hematological: Negative for adenopathy. Does not bruise/bleed easily.  Psychiatric/Behavioral: Negative for  dysphoric mood. The patient is nervous/anxious.    Objective:  BP 138/84 (BP Location: Left Arm, Patient Position: Sitting, Cuff Size: Large)   Pulse (!) 102   Temp 97.6 F (36.4 C) (Temporal)   Ht 5' 6.25" (1.683 m)   Wt 236 lb 4 oz  (107.2 kg)   LMP 05/22/2020   SpO2 98%   BMI 37.84 kg/m   Wt Readings from Last 3 Encounters:  06/10/20 236 lb 4 oz (107.2 kg)  02/25/19 233 lb 6 oz (105.9 kg)  07/24/18 220 lb 7.4 oz (100 kg)      Physical Exam Vitals and nursing note reviewed.  Constitutional:      General: She is not in acute distress.    Appearance: Normal appearance. She is well-developed and well-nourished. She is not ill-appearing.  HENT:     Head: Normocephalic and atraumatic.     Right Ear: Hearing, tympanic membrane, ear canal and external ear normal.     Left Ear: Hearing, tympanic membrane, ear canal and external ear normal.     Nose: Mucosal edema and congestion present. No rhinorrhea.     Right Turbinates: Enlarged and swollen.     Left Turbinates: Swollen and pale.     Comments: Marked pallor to left nasal mucosa - side of chronic congestion    Mouth/Throat:     Mouth: Oropharynx is clear and moist and mucous membranes are normal.     Pharynx: No posterior oropharyngeal edema.  Eyes:     General: No scleral icterus.    Extraocular Movements: Extraocular movements intact and EOM normal.     Conjunctiva/sclera: Conjunctivae normal.     Pupils: Pupils are equal, round, and reactive to light.  Neck:     Thyroid: No thyroid mass or thyromegaly.  Cardiovascular:     Rate and Rhythm: Normal rate and regular rhythm.     Pulses: Normal pulses and intact distal pulses.          Radial pulses are 2+ on the right side and 2+ on the left side.     Heart sounds: Normal heart sounds. No murmur heard.   Pulmonary:     Effort: Pulmonary effort is normal. No respiratory distress.     Breath sounds: Normal breath sounds. No wheezing, rhonchi or rales.  Abdominal:     General: Abdomen is flat. Bowel sounds are normal. There is no distension.     Palpations: Abdomen is soft. There is no mass.     Tenderness: There is no abdominal tenderness. There is no guarding or rebound.     Hernia: No hernia is present.   Musculoskeletal:        General: No edema. Normal range of motion.     Cervical back: Normal range of motion and neck supple.     Right lower leg: No edema.     Left lower leg: No edema.  Lymphadenopathy:     Cervical: No cervical adenopathy.  Skin:    General: Skin is warm and dry.     Findings: No rash.  Neurological:     General: No focal deficit present.     Mental Status: She is alert and oriented to person, place, and time.     Comments: CN grossly intact, station and gait intact  Psychiatric:        Mood and Affect: Mood and affect and mood normal.        Behavior: Behavior normal.  Thought Content: Thought content normal.        Judgment: Judgment normal.       Results for orders placed or performed in visit on 05/17/20  T4, free  Result Value Ref Range   Free T4 0.84 0.60 - 1.60 ng/dL  Lipid panel  Result Value Ref Range   Cholesterol 229 (H) 0 - 200 mg/dL   Triglycerides 184.0 (H) 0.0 - 149.0 mg/dL   HDL 73.50 >39.00 mg/dL   VLDL 36.8 0.0 - 40.0 mg/dL   LDL Cholesterol 119 (H) 0 - 99 mg/dL   Total CHOL/HDL Ratio 3    NonHDL 155.37   Comprehensive metabolic panel  Result Value Ref Range   Sodium 137 135 - 145 mEq/L   Potassium 4.3 3.5 - 5.1 mEq/L   Chloride 105 96 - 112 mEq/L   CO2 25 19 - 32 mEq/L   Glucose, Bld 96 70 - 99 mg/dL   BUN 15 6 - 23 mg/dL   Creatinine, Ser 0.99 0.40 - 1.20 mg/dL   Total Bilirubin 0.5 0.2 - 1.2 mg/dL   Alkaline Phosphatase 64 39 - 117 U/L   AST 18 0 - 37 U/L   ALT 20 0 - 35 U/L   Total Protein 7.4 6.0 - 8.3 g/dL   Albumin 4.2 3.5 - 5.2 g/dL   GFR 68.82 >60.00 mL/min   Calcium 9.5 8.4 - 10.5 mg/dL  TSH  Result Value Ref Range   TSH 1.50 0.35 - 4.50 uIU/mL   Assessment & Plan:  This visit occurred during the SARS-CoV-2 public health emergency.  Safety protocols were in place, including screening questions prior to the visit, additional usage of staff PPE, and extensive cleaning of exam room while observing  appropriate contact time as indicated for disinfecting solutions.   Problem List Items Addressed This Visit    Severe obesity (BMI 35.0-39.9) with comorbidity ([place])    Encouraged healthy diet and lifestyle choices to affect sustainable weight loss.       Perennial allergic rhinitis    Add singulair to daily antihistamine flonase caused itching      Hypothyroidism    Chronic, stable. Continue levothyroxine 174mg daily.       Relevant Medications   levothyroxine (SYNTHROID) 125 MCG tablet   Health maintenance examination - Primary    Preventative protocols reviewed and updated unless pt declined. Discussed healthy diet and lifestyle.       Dyslipidemia    Chronic, above goal. Reviewed diet choices to improve cholesterol levels.  The 10-year ASCVD risk score (Mikey BussingDC JBrooke Bonito, et al., 2013) is: 0.7%   Values used to calculate the score:     Age: [age]years     Sex: Female     Is Non-Hispanic African American: No     Diabetic: No     Tobacco smoker: No     Systolic Blood Pressure: 1536mmHg     Is BP treated: No     HDL Cholesterol: 73.5 mg/dL     Total Cholesterol: 229 mg/dL       Asthma    Chronic, not under optimal control as evidenced by continued need for albuterol inhaler daily. Will start singulair 177mnightly given concomitant significant perennial allergic rhinitis, predominant nasal congestion.       Relevant Medications   budesonide-formoterol (SYMBICORT) 80-4.5 MCG/ACT inhaler   montelukast (SINGULAIR) 10 MG tablet    Other Visit Diagnoses    Special screening for malignant neoplasms, colon  Relevant Orders   Fecal occult blood, imunochemical   Need for influenza vaccination       Relevant Orders   Flu Vaccine QUAD 36+ mos IM (Completed)       Meds ordered this encounter  Medications  . levothyroxine (SYNTHROID) 125 MCG tablet    Sig: Take 1 tablet (125 mcg total) by mouth daily.    Dispense:  90 tablet    Refill:  3  . budesonide-formoterol  (SYMBICORT) 80-4.5 MCG/ACT inhaler    Sig: Inhale 1 puff into the lungs 2 (two) times daily.    Dispense:  30.6 g    Refill:  3  . montelukast (SINGULAIR) 10 MG tablet    Sig: Take 1 tablet (10 mg total) by mouth at bedtime.    Dispense:  30 tablet    Refill:  11  . Omega-3 Fatty Acids (FISH OIL) 1200 MG CAPS    Sig: Take 2 capsules (2,400 mg total) by mouth at bedtime.   Orders Placed This Encounter  Procedures  . Fecal occult blood, imunochemical    Standing Status:   Future    Standing Expiration Date:   06/10/2021  . Flu Vaccine QUAD 36+ mos IM    Patient instructions: Flu shot today Pass by lab to pick up stool kit.  We will request latest mammogram and [facility] Philis Pique)  Start singulair 65m at night time - this should help both asthma and allergies/sinus congestion. Update me with effect of singulair.  Return as needed or in 1 year for next physical.   Follow up plan: Return in about 1 year (around 06/10/2021), or if symptoms worsen or fail to improve, for annual exam, prior fasting for blood work.  JRia Bush MD

## 2020-06-10 NOTE — Assessment & Plan Note (Signed)
Chronic, above goal. Reviewed diet choices to improve cholesterol levels.  The 10-year ASCVD risk score Denman George DC Montez Hageman., et al., 2013) is: 0.7%   Values used to calculate the score:     Age: 45 years     Sex: Female     Is Non-Hispanic African American: No     Diabetic: No     Tobacco smoker: No     Systolic Blood Pressure: 138 mmHg     Is BP treated: No     HDL Cholesterol: 73.5 mg/dL     Total Cholesterol: 229 mg/dL

## 2020-06-10 NOTE — Assessment & Plan Note (Signed)
Chronic, not under optimal control as evidenced by continued need for albuterol inhaler daily. Will start singulair 10mg  nightly given concomitant significant perennial allergic rhinitis, predominant nasal congestion.

## 2020-06-10 NOTE — Patient Instructions (Addendum)
Flu shot today Pass by lab to pick up stool kit.  We will request latest mammogram and pap smear from [facility] Tabitha Spence)  Start singulair 32m at night time - this should help both asthma and allergies/sinus congestion. Update me with effect of singulair.  Return as needed or in 1 year for next physical.   Health Maintenance, Female Adopting a healthy lifestyle and getting preventive care are important in promoting health and wellness. Ask your health care provider about:  The right schedule for you to have regular tests and exams.  Things you can do on your own to prevent diseases and keep yourself healthy. What should I know about diet, weight, and exercise? Eat a healthy diet  Eat a diet that includes plenty of vegetables, fruits, low-fat dairy products, and lean protein.  Do not eat a lot of foods that are high in solid fats, added sugars, or sodium.   Maintain a healthy weight Body mass index (BMI) is used to identify weight problems. It estimates body fat based on height and weight. Your health care provider can help determine your BMI and help you achieve or maintain a healthy weight. Get regular exercise Get regular exercise. This is one of the most important things you can do for your health. Most adults should:  Exercise for at least 150 minutes each week. The exercise should increase your heart rate and make you sweat (moderate-intensity exercise).  Do strengthening exercises at least twice a week. This is in addition to the moderate-intensity exercise.  Spend less time sitting. Even light physical activity can be beneficial. Watch cholesterol and blood lipids Have your blood tested for lipids and cholesterol at 46years of age, then have this test every 5 years. Have your cholesterol levels checked more often if:  Your lipid or cholesterol levels are high.  You are older than 46years of age.  You are at high risk for heart disease. What should I know about  cancer screening? Depending on your health history and family history, you may need to have cancer screening at various ages. This may include screening for:  Breast cancer.  Cervical cancer.  Colorectal cancer.  Skin cancer.  Lung cancer. What should I know about heart disease, diabetes, and high blood pressure? Blood pressure and heart disease  High blood pressure causes heart disease and increases the risk of stroke. This is more likely to develop in people who have high blood pressure readings, are of African descent, or are overweight.  Have your blood pressure checked: ? Every 3-5 years if you are [age]years of age. ? Every year if you are 460years old or older. Diabetes Have regular diabetes screenings. This checks your fasting blood sugar level. Have the screening done:  Once every three years after age 2363if you are at a normal weight and have a low risk for diabetes.  More often and at a younger age if you are overweight or have a high risk for diabetes. What should I know about preventing infection? Hepatitis B If you have a higher risk for hepatitis B, you should be screened for this virus. Talk with your health care provider to find out if you are at risk for hepatitis B infection. Hepatitis C Testing is recommended for:  Everyone born from [birth year]through 1966.  Anyone with known risk factors for hepatitis C. Sexually transmitted infections (STIs)  Get screened for STIs, including gonorrhea and chlamydia, if: ? You are sexually active  and are younger than 46 years of age. ? You are older than 46 years of age and your health care provider tells you that you are at risk for this type of infection. ? Your sexual activity has changed since you were last screened, and you are at increased risk for chlamydia or gonorrhea. Ask your health care provider if you are at risk.  Ask your health care provider about whether you are at high risk for HIV. Your health care  provider may recommend a prescription medicine to help prevent HIV infection. If you choose to take medicine to prevent HIV, you should first get tested for HIV. You should then be tested every 3 months for as long as you are taking the medicine. Pregnancy  If you are about to stop having your period (premenopausal) and you may become pregnant, seek counseling before you get pregnant.  Take 400 to 800 micrograms (mcg) of folic acid every day if you become pregnant.  Ask for birth control (contraception) if you want to prevent pregnancy. Osteoporosis and menopause Osteoporosis is a disease in which the bones lose minerals and strength with aging. This can result in bone fractures. If you are 93 years old or older, or if you are at risk for osteoporosis and fractures, ask your health care provider if you should:  Be screened for bone loss.  Take a calcium or vitamin D supplement to lower your risk of fractures.  Be given hormone replacement therapy (HRT) to treat symptoms of menopause. Follow these instructions at home: Lifestyle  Do not use any products that contain nicotine or tobacco, such as cigarettes, e-cigarettes, and chewing tobacco. If you need help quitting, ask your health care provider.  Do not use street drugs.  Do not share needles.  Ask your health care provider for help if you need support or information about quitting drugs. Alcohol use  Do not drink alcohol if: ? Your health care provider tells you not to drink. ? You are pregnant, may be pregnant, or are planning to become pregnant.  If you drink alcohol: ? Limit how much you use to 0-1 drink a day. ? Limit intake if you are breastfeeding.  Be aware of how much alcohol is in your drink. In the [place] one drink equals one 12 oz bottle of beer (355 mL), one 5 oz glass of wine (148 mL), or one 1 oz glass of hard liquor (44 mL). General instructions  Schedule regular health, dental, and eye exams.  Stay current  with your vaccines.  Tell your health care provider if: ? You often feel depressed. ? You have ever been abused or do not feel safe at home. Summary  Adopting a healthy lifestyle and getting preventive care are important in promoting health and wellness.  Follow your health care provider's instructions about healthy diet, exercising, and getting tested or screened for diseases.  Follow your health care provider's instructions on monitoring your cholesterol and blood pressure. This information is not intended to replace advice given to you by your health care provider. Make sure you discuss any questions you have with your health care provider. Document Revised: 04/16/2018 Document Reviewed: 04/16/2018 Elsevier Patient Education  2021 [employer].

## 2020-06-10 NOTE — Assessment & Plan Note (Signed)
Preventative protocols reviewed and updated unless pt declined. Discussed healthy diet and lifestyle.  

## 2020-06-10 NOTE — Assessment & Plan Note (Signed)
Encouraged healthy diet and lifestyle choices to affect sustainable weight loss.  ?

## 2020-06-10 NOTE — Assessment & Plan Note (Addendum)
Add singulair to daily antihistamine flonase caused itching

## 2020-06-14 ENCOUNTER — Encounter: Payer: Self-pay | Admitting: Family Medicine

## 2020-06-14 ENCOUNTER — Other Ambulatory Visit (INDEPENDENT_AMBULATORY_CARE_PROVIDER_SITE_OTHER): Payer: BC Managed Care – PPO

## 2020-06-14 DIAGNOSIS — Z1211 Encounter for screening for malignant neoplasm of colon: Secondary | ICD-10-CM | POA: Diagnosis not present

## 2020-06-14 LAB — FECAL OCCULT BLOOD, GUAIAC: Fecal Occult Blood: NEGATIVE

## 2020-06-14 LAB — FECAL OCCULT BLOOD, IMMUNOCHEMICAL: Fecal Occult Bld: NEGATIVE

## 2020-07-01 DIAGNOSIS — Z3202 Encounter for pregnancy test, result negative: Secondary | ICD-10-CM | POA: Diagnosis not present

## 2020-07-01 DIAGNOSIS — Z30430 Encounter for insertion of intrauterine contraceptive device: Secondary | ICD-10-CM | POA: Diagnosis not present

## 2020-09-22 DIAGNOSIS — D225 Melanocytic nevi of trunk: Secondary | ICD-10-CM | POA: Diagnosis not present

## 2020-09-22 DIAGNOSIS — Z1283 Encounter for screening for malignant neoplasm of skin: Secondary | ICD-10-CM | POA: Diagnosis not present

## 2020-09-22 DIAGNOSIS — L218 Other seborrheic dermatitis: Secondary | ICD-10-CM | POA: Diagnosis not present

## 2020-09-22 DIAGNOSIS — D485 Neoplasm of uncertain behavior of skin: Secondary | ICD-10-CM | POA: Diagnosis not present

## 2020-10-31 ENCOUNTER — Encounter: Payer: Self-pay | Admitting: Family Medicine

## 2020-10-31 ENCOUNTER — Telehealth (INDEPENDENT_AMBULATORY_CARE_PROVIDER_SITE_OTHER): Payer: BC Managed Care – PPO | Admitting: Family Medicine

## 2020-10-31 ENCOUNTER — Telehealth: Payer: Self-pay

## 2020-10-31 VITALS — BP 139/76 | HR 73 | Temp 97.2°F | Ht 66.25 in | Wt 206.4 lb

## 2020-10-31 DIAGNOSIS — J4530 Mild persistent asthma, uncomplicated: Secondary | ICD-10-CM | POA: Diagnosis not present

## 2020-10-31 DIAGNOSIS — U071 COVID-19: Secondary | ICD-10-CM

## 2020-10-31 DIAGNOSIS — E669 Obesity, unspecified: Secondary | ICD-10-CM

## 2020-10-31 MED ORDER — NIRMATRELVIR/RITONAVIR (PAXLOVID)TABLET: 3.0000 | ORAL_TABLET | Freq: Two times a day (BID) | ORAL | 0 refills | Status: AC

## 2020-10-31 NOTE — Telephone Encounter (Signed)
Pt agreed to virtual visit at 5:00. I have placed this on schedule

## 2020-10-31 NOTE — Progress Notes (Signed)
Patient ID: Tabitha Spence, female    DOB: 11-15-1975, 45 y.o.   MRN: [id number]  Virtual visit completed through MyChart, a video enabled telemedicine application. Due to national recommendations of social distancing due to COVID-19, a virtual visit is felt to be most appropriate for this patient at this time. Reviewed limitations, risks, security and privacy concerns of performing a virtual visit and the availability of in person appointments. I also reviewed that there may be a patient responsible charge related to this service. The patient agreed to proceed.   Patient location: home Provider location: [place] at [facility], office Persons participating in this virtual visit: patient, provider   If any vitals were documented, they were collected by patient at home unless specified below.    BP 139/76   Pulse 73   Temp (!) 97.2 F (36.2 C)   Ht 5' 6.25" (1.683 m)   Wt 206 lb 6 oz (93.6 kg)   LMP 10/26/2020   BMI 33.06 kg/m    CC: COVID Subjective:   HPI: Tabitha Spence is a 45 y.o. female presenting on 10/31/2020 for Fever (C/o fever- max 101.9, cough- worsening today, body aches, HA, fatigue and runny nose.  Sxs started 10/29/20.  Positive home COVID results yesterday.  1st Moderna booster 03/19/20. )   First day of symptoms: 10/29/2020 Tested COVID positive: 10/30/2020  Current symptoms: fever Tmax 101.9, dry cough, body aches, headache, fatigue and rhinorrhea  Lots of airport travel recently (to [place]) No: no loss of taste/smell, dyspnea, ST, abd pain, nausea, diarrhea  Treatments to date: nyquil, dayquil, ibuprofen, continues symbicort, xyzal and singulair  Risk factors include: weight, asthma  COVID vaccination status: Moderna 07/2019 x2, Moderna booster  03/2020     Relevant past medical, surgical, family and social history reviewed and updated as indicated. Interim medical history since our last visit reviewed. Allergies and medications reviewed and  updated. Outpatient Medications Prior to Visit  Medication Sig Dispense Refill   albuterol (VENTOLIN HFA) 108 (90 Base) MCG/ACT inhaler INHALE 1-2 PUFFS INTO THE LUNGS EVERY 6 (SIX) HOURS AS NEEDED 18 each 1   Boswellia-Glucosamine-Vit D (OSTEO BI-FLEX ONE PER DAY) TABS Take by mouth daily.     budesonide-formoterol (SYMBICORT) 80-4.5 MCG/ACT inhaler Inhale 1 puff into the lungs 2 (two) times daily. 30.6 g 3   levocetirizine (XYZAL) 5 MG tablet      levonorgestrel (MIRENA, 52 MG,) 20 MCG/DAY IUD Mirena 20 mcg/24 hours (7 yrs) 52 mg intrauterine device  [facility] by [facility]     levothyroxine (SYNTHROID) 125 MCG tablet Take 1 tablet (125 mcg total) by mouth daily. 90 tablet 3   montelukast (SINGULAIR) 10 MG tablet Take 1 tablet (10 mg total) by mouth at bedtime. 30 tablet 11   Multiple Vitamins-Minerals (MULTIVITAMIN ADULTS PO) Take by mouth daily.     Omega-3 Fatty Acids (FISH OIL) 1200 MG CAPS Take 2 capsules (2,400 mg total) by mouth at bedtime.     No facility-administered medications prior to visit.     Per HPI unless specifically indicated in ROS section below Review of Systems Objective:  BP 139/76   Pulse 73   Temp (!) 97.2 F (36.2 C)   Ht 5' 6.25" (1.683 m)   Wt 206 lb 6 oz (93.6 kg)   LMP 10/26/2020   BMI 33.06 kg/m   Wt Readings from Last 3 Encounters:  10/31/20 206 lb 6 oz (93.6 kg)  06/10/20 236 lb 4 oz (107.2 kg)  02/25/19 233 lb 6 oz (105.9 kg)       Physical exam: Gen: alert, NAD, not ill appearing Pulm: speaks in complete sentences without increased work of breathing Psych: normal mood, normal thought content      Lab Results  Component Value Date   CREATININE 0.99 05/17/2020   BUN 15 05/17/2020   NA 137 05/17/2020   K 4.3 05/17/2020   CL 105 05/17/2020   CO2 25 05/17/2020   GFR = 68 Assessment & Plan:   Problem List Items Addressed This Visit     Obesity, Class I, BMI 30-34.9   Asthma    Discussed symbicort, xyzal, singulair and albuterol  use in setting of COVID infection.        COVID-19 virus infection - Primary    Recommend:  Paxlovid full dose Drug interactions: 1. Budesonide concentrations may be increased while on Paxlovid 2. Mirena - levonorgestrel concentrations may be increased - discussed monitoring for progestin toxicities (acne, blood clot, insulin resistance).  Reviewed expected course of illness, anticipated course of recovery, as well as red flags to suggested COVID pneumonia or to seek urgent in-person care. Reviewed latest CDC isolation/quarantining guidelines.  Encouraged fluids and rest. Reviewed further supportive care measures at home including vit C, vit D, zinc, tylenol PRN.         Relevant Medications   nirmatrelvir/ritonavir EUA (PAXLOVID) TABS     Meds ordered this encounter  Medications   nirmatrelvir/ritonavir EUA (PAXLOVID) TABS    Sig: Take 3 tablets by mouth 2 (two) times daily for 5 days. (Take nirmatrelvir 150 mg two tablets twice daily for 5 days and ritonavir 100 mg one tablet twice daily for 5 days) Patient GFR is 68    Dispense:  30 tablet    Refill:  0    No orders of the defined types were placed in this encounter.   I discussed the assessment and treatment plan with the patient. The patient was provided an opportunity to ask questions and all were answered. The patient agreed with the plan and demonstrated an understanding of the instructions. The patient was advised to call back or seek an in-person evaluation if the symptoms worsen or if the condition fails to improve as anticipated.  Follow up plan: Return if symptoms worsen or fail to improve.  Eustaquio Boyden, MD

## 2020-10-31 NOTE — Telephone Encounter (Signed)
Plz schedule covid virtual visit today at 5pm.

## 2020-10-31 NOTE — Assessment & Plan Note (Addendum)
Discussed symbicort, xyzal, singulair and albuterol use in setting of COVID infection.

## 2020-10-31 NOTE — Assessment & Plan Note (Addendum)
Recommend:  Paxlovid full dose Drug interactions: 1. Budesonide concentrations may be increased while on Paxlovid 2. Mirena - levonorgestrel concentrations may be increased - discussed monitoring for progestin toxicities (acne, blood clot, insulin resistance).  Reviewed expected course of illness, anticipated course of recovery, as well as red flags to suggested COVID pneumonia or to seek urgent in-person care. Reviewed latest CDC isolation/quarantining guidelines.  Encouraged fluids and rest. Reviewed further supportive care measures at home including vit C, vit D, zinc, tylenol PRN.

## 2020-10-31 NOTE — Telephone Encounter (Signed)
Pt tested positive for covid yesterday, 10/30/20. Fever of 101.9 (oral) on 10/30/20. Onset of symptoms 10/29/20, consisting of headache, body aches and fatigue. Pt denies SOB. Has asthma and inhaler to use if needed.  Sending to PCP, high priority.

## 2020-11-25 ENCOUNTER — Other Ambulatory Visit: Payer: Self-pay

## 2020-11-25 ENCOUNTER — Encounter: Payer: Self-pay | Admitting: Family Medicine

## 2020-11-25 ENCOUNTER — Ambulatory Visit (INDEPENDENT_AMBULATORY_CARE_PROVIDER_SITE_OTHER): Payer: BC Managed Care – PPO | Admitting: Family Medicine

## 2020-11-25 VITALS — BP 118/70 | HR 72 | Temp 97.6°F | Ht 66.25 in | Wt 215.6 lb

## 2020-11-25 DIAGNOSIS — M238X9 Other internal derangements of unspecified knee: Secondary | ICD-10-CM

## 2020-11-25 DIAGNOSIS — M222X1 Patellofemoral disorders, right knee: Secondary | ICD-10-CM | POA: Insufficient documentation

## 2020-11-25 NOTE — Assessment & Plan Note (Signed)
Longstanding h/o this.

## 2020-11-25 NOTE — Progress Notes (Signed)
Patient ID: Tabitha Spence, female    DOB: 29-Nov-1975, 45 y.o.   MRN: [id number]  This visit was conducted in person.  BP 118/70   Pulse 72   Temp 97.6 F (36.4 C) (Temporal)   Ht 5' 6.25" (1.683 m)   Wt 215 lb 9 oz (97.8 kg)   LMP 10/26/2020   SpO2 98%   BMI 34.53 kg/m    CC: R knee pain Subjective:   HPI: Tabitha Spence is a 45 y.o. female presenting on 11/25/2020 for Knee Pain (C/o right knee, worsening.  Mainly occurs when sitting, driving and descending stairs.  Pain is disturbing sleep, 7 of 10 pain scale.  Started about 1 mo ago.  Tried Aleve, not helpful. )   1 mo h/o R knee pain worse after she increased exercise routine. Describes shooting pain and ache, points to anterior knee below patella.  Pain most noticeable with sitting more than 10 minutes , in bed (when knee in extension), and when going down stairs.   Denies inciting trauma/injury or falls.  No fevers, redness, rash. No locking of the knee or knee instability.  Treating pain with aleve with benefit.   Recent vacation to [place] - lots of walking at this time.      Relevant past medical, surgical, family and social history reviewed and updated as indicated. Interim medical history since our last visit reviewed. Allergies and medications reviewed and updated. Outpatient Medications Prior to Visit  Medication Sig Dispense Refill   albuterol (VENTOLIN HFA) 108 (90 Base) MCG/ACT inhaler INHALE 1-2 PUFFS INTO THE LUNGS EVERY 6 (SIX) HOURS AS NEEDED 18 each 1   Boswellia-Glucosamine-Vit D (OSTEO BI-FLEX ONE PER DAY) TABS Take by mouth daily.     budesonide-formoterol (SYMBICORT) 80-4.5 MCG/ACT inhaler Inhale 1 puff into the lungs 2 (two) times daily. 30.6 g 3   levocetirizine (XYZAL) 5 MG tablet      levonorgestrel (MIRENA, 52 MG,) 20 MCG/DAY IUD Mirena 20 mcg/24 hours (7 yrs) 52 mg intrauterine device  [facility]     levothyroxine (SYNTHROID) 125 MCG tablet Take 1 tablet (125 mcg total) by mouth  daily. 90 tablet 3   montelukast (SINGULAIR) 10 MG tablet Take 1 tablet (10 mg total) by mouth at bedtime. 30 tablet 11   Multiple Vitamins-Minerals (MULTIVITAMIN ADULTS PO) Take by mouth daily.     Omega-3 Fatty Acids (FISH OIL) 1200 MG CAPS Take 2 capsules (2,400 mg total) by mouth at bedtime.     No facility-administered medications prior to visit.     Per HPI unless specifically indicated in ROS section below Review of Systems  Objective:  BP 118/70   Pulse 72   Temp 97.6 F (36.4 C) (Temporal)   Ht 5' 6.25" (1.683 m)   Wt 215 lb 9 oz (97.8 kg)   LMP 10/26/2020   SpO2 98%   BMI 34.53 kg/m   Wt Readings from Last 3 Encounters:  11/25/20 215 lb 9 oz (97.8 kg)  10/31/20 206 lb 6 oz (93.6 kg)  06/10/20 236 lb 4 oz (107.2 kg)      Physical Exam Vitals and nursing note reviewed.  Constitutional:      Appearance: Normal appearance. She is not ill-appearing.  Musculoskeletal:        General: No swelling or tenderness. Normal range of motion.     Right lower leg: No edema.     Left lower leg: No edema.     Comments:  Left knee WNL Right knee exam: No deformity on inspection. No pain with palpation of knee landmarks. No effusion/swelling noted. FROM in flex/extension with marked crepitus. No popliteal fullness. Neg drawer test. Neg mcmurray test. No pain with valgus/varus stress. + pain with PFgrind. No abnormal patellar mobility.   Skin:    General: Skin is warm and dry.     Findings: No rash.  Neurological:     Mental Status: She is alert.     Comments: 5/5 strength BLE       Assessment & Plan:  This visit occurred during the SARS-CoV-2 public health emergency.  Safety protocols were in place, including screening questions prior to the visit, additional usage of staff PPE, and extensive cleaning of exam room while observing appropriate contact time as indicated for disinfecting solutions.   Problem List Items Addressed This Visit     Knee crepitus     Longstanding h/o this.        Patellofemoral pain syndrome of right knee - Primary    Story/exam most consistent with this.  Recommend conservative treatment as per instructions.  rec voltaren gel, alleve 440mg  BID x 1 wk then PRN, rest, elevation, compression, and ice to knee.  Update in 1 wk if not improving with treatment for xray and consider PT if ongoing Pt agrees with plan.          No orders of the defined types were placed in this encounter.  No orders of the defined types were placed in this encounter.    Patient instructions: I think you have runner's knee or patellofemoral pain syndrome.  Treat with exercises provided today, aleve 2 tablets twice daily with food, elevation of leg, rest, ice to knee, and knee brace.  May also use voltaren gel topically over the counter 2-3 times daily to the knee  Let know if not improving with above for knee xray.   Follow up plan: Return if symptoms worsen or fail to improve.  [place], MD

## 2020-11-25 NOTE — Patient Instructions (Signed)
I think you have runner's knee or patellofemoral pain syndrome.  Treat with exercises provided today, aleve 2 tablets twice daily with food, elevation of leg, rest, ice to knee, and knee brace.  May also use voltaren gel topically over the counter 2-3 times daily to the knee  Let us know if not improving with above for knee xray.   Patellofemoral Pain Syndrome  Patellofemoral pain syndrome is a condition in which the tissue (cartilage) on the underside of the kneecap (patella) softens or breaks down. This causes pain in the front of the knee. The condition is also called runner's knee or chondromalacia patella. Patellofemoral pain syndrome is most common in young adults who are active insports. The knee is the largest joint in the body. The patella covers the front of the knee and is attached to muscles above and below the knee. The underside of the patella is covered with a smooth type of cartilage (synovium). The smooth surface helps the patella glide easily when you move your knee. Patellofemoral pain syndrome causes swelling in the joint linings and bonesurfaces in the knee. What are the causes? This condition may be caused by: Overuse of the knee. Poor alignment of your knee joints. Weak leg muscles. A direct hit to your kneecap. What increases the risk? You are more likely to develop this condition if: You do a lot of activities that can wear down your kneecap. These include: Running. Squatting. Climbing stairs. You start a new physical activity or exercise program. You wear shoes that do not fit well. You do not have good leg strength. You are overweight. What are the signs or symptoms? The main symptom of this condition is knee pain. This may feel like a dull, aching pain underneath your patella, in the front of your knee. There may be a popping or cracking sound when you move your knee. Pain may get worse with: Exercise. Climbing  stairs. Running. Jumping. Squatting. Kneeling. Sitting for a long time. Moving or pushing on your patella. How is this diagnosed? This condition may be diagnosed based on: Your symptoms and medical history. You may be asked about your recent physical activities and which ones cause knee pain. A physical exam. This may include: Moving your patella back and forth. Checking your range of knee motion. Having you squat or jump to see if you have pain. Checking the strength of your leg muscles. Imaging tests to confirm the diagnosis. These may include an MRI of your knee. How is this treated? This condition may be treated at home with rest, ice, compression, andelevation (RICE).  Other treatments may include: NSAIDs, such as ibuprofen. Physical therapy to stretch and strengthen your leg muscles. Shoe inserts (orthotics) to take stress off your knee. A knee brace or knee support. Adhesive tapes to the skin. Surgery to remove damaged cartilage or move the patella to a better position. This is rare. Follow these instructions at home: If you have a brace: Wear the brace as told by your health care provider. Remove it only as told by your health care provider. Loosen the brace if your toes tingle, become numb, or turn cold and blue. Keep the brace clean. If the brace is not waterproof: Do not let it get wet. Cover it with a watertight covering when you take a bath or a shower. Managing pain, stiffness, and swelling  If directed, put ice on the painful area. To do this: If you have a removable brace, remove it as told by your  health care provider. Put ice in a plastic bag. Place a towel between your skin and the bag. Leave the ice on for 20 minutes, 2-3 times a day. Remove the ice if your skin turns bright red. This is very important. If you cannot feel pain, heat, or cold, you have a greater risk of damage to the area. Move your toes often to reduce stiffness and swelling. Raise  (elevate) the injured area above the level of your heart while you are sitting or lying down.  Activity Rest your knee. Avoid activities that cause knee pain. Perform stretching and strengthening exercises as told by your health care provider or physical therapist. Return to your normal activities as told by your health care provider. Ask your health care provider what activities are safe for you. General instructions Take over-the-counter and prescription medicines only as told by your health care provider. Use splints, braces, knee supports, or walking aids as directed by your health care provider. Do not use any products that contain nicotine or tobacco, such as cigarettes, e-cigarettes, and chewing tobacco. These can delay healing. If you need help quitting, ask your health care provider. Keep all follow-up visits. This is important. Contact a health care provider if: Your symptoms get worse. You are not improving with home care. Summary Patellofemoral pain syndrome is a condition in which the tissue (cartilage) on the underside of the kneecap (patella) softens or breaks down. This condition causes swelling in the joint linings and bone surfaces in the knee. This leads to pain in the front of the knee. This condition may be treated at home with rest, ice, compression, and elevation (RICE). Use splints, braces, knee supports, or walking aids as directed by your health care provider. This information is not intended to replace advice given to you by your health care provider. Make sure you discuss any questions you have with your healthcare provider. Document Revised: 10/07/2019 Document Reviewed: 10/07/2019 Elsevier Patient Education  2022 [employer].

## 2020-11-25 NOTE — Assessment & Plan Note (Addendum)
Story/exam most consistent with this.  Recommend conservative treatment as per instructions.  rec voltaren gel, alleve 440mg  BID x 1 wk then PRN, rest, elevation, compression, and ice to knee.  Update in 1 wk if not improving with treatment for xray and consider PT if ongoing Pt agrees with plan.

## 2021-06-08 ENCOUNTER — Other Ambulatory Visit: Payer: Self-pay | Admitting: Family Medicine

## 2021-06-14 NOTE — Telephone Encounter (Signed)
E-scribed refill.  Plz schedule lab and CPE

## 2021-06-15 NOTE — Telephone Encounter (Signed)
Lvm for pt to call to schedule cpe/lab and sent my chart letter °

## 2021-07-11 ENCOUNTER — Other Ambulatory Visit: Payer: Self-pay | Admitting: Family Medicine

## 2021-07-11 NOTE — Telephone Encounter (Signed)
Please call patient and schedule CPE.  

## 2021-09-08 NOTE — Telephone Encounter (Signed)
Patient declined. Found new pcp. ?

## 2022-10-05 ENCOUNTER — Encounter: Payer: Self-pay | Admitting: Internal Medicine

## 2022-10-08 ENCOUNTER — Other Ambulatory Visit: Payer: Self-pay | Admitting: Internal Medicine

## 2022-10-08 ENCOUNTER — Ambulatory Visit: Payer: BC Managed Care – PPO | Attending: Internal Medicine | Admitting: Internal Medicine

## 2022-10-08 ENCOUNTER — Telehealth: Payer: Self-pay | Admitting: Internal Medicine

## 2022-10-08 ENCOUNTER — Encounter: Payer: Self-pay | Admitting: Internal Medicine

## 2022-10-08 ENCOUNTER — Ambulatory Visit: Payer: BC Managed Care – PPO | Attending: Internal Medicine

## 2022-10-08 VITALS — BP 158/80 | HR 85 | Ht 66.0 in | Wt 257.0 lb

## 2022-10-08 DIAGNOSIS — R011 Cardiac murmur, unspecified: Secondary | ICD-10-CM

## 2022-10-08 DIAGNOSIS — I499 Cardiac arrhythmia, unspecified: Secondary | ICD-10-CM | POA: Diagnosis not present

## 2022-10-08 DIAGNOSIS — R002 Palpitations: Secondary | ICD-10-CM

## 2022-10-08 NOTE — Patient Instructions (Addendum)
Medication Instructions:  Your physician recommends that you continue on your current medications as directed. Please refer to the Current Medication list given to you today.   Labwork: None  Testing/Procedures: Your physician has requested that you have an echocardiogram. Echocardiography is a painless test that uses sound waves to create images of your heart. It provides your doctor with information about the size and shape of your heart and how well your heart's chambers and valves are working. This procedure takes approximately one hour. There are no restrictions for this procedure. Please do NOT wear cologne, perfume, aftershave, or lotions (deodorant is allowed). Please arrive 15 minutes prior to your appointment time.  Your physician has recommended that you wear a Zio monitor.   This monitor is a medical device that records the heart's electrical activity. Doctors most often use these monitors to diagnose arrhythmias. Arrhythmias are problems with the speed or rhythm of the heartbeat. The monitor is a small device applied to your chest. You can wear one while you do your normal daily activities. While wearing this monitor if you have any symptoms to push the button and record what you felt. Once you have worn this monitor for the period of time provider prescribed (for 14 days), you will return the monitor device in the postage paid box. Once it is returned they will download the data collected and provide us with a report which the provider will then review and we will call you with those results. Important tips:  Avoid showering during the first 48 hours of wearing the monitor. Avoid excessive sweating to help maximize wear time. Do not submerge the device, no hot tubs, and no swimming pools. Keep any lotions or oils away from the patch. After 48 hours you may shower with the patch on. Take brief showers with your back facing the shower head.  Do not remove patch once it has been placed  because that will interrupt data and decrease adhesive wear time. Push the button when you have any symptoms and write down what you were feeling. Once you have completed wearing your monitor, remove and place into box which has postage paid and place in your outgoing mailbox.  If for some reason you have misplaced your box then call our office and we can provide another box and/or mail it off for you.   Follow-Up: Your physician recommends that you schedule a follow-up appointment in: Pending   Any Other Special Instructions Will Be Listed Below (If Applicable).  If you need a refill on your cardiac medications before your next appointment, please call your pharmacy.  

## 2022-10-08 NOTE — Telephone Encounter (Signed)
Checking percert on the following patient for testing scheduled at [facility].    ECHO  11/20/2022  

## 2022-10-08 NOTE — Progress Notes (Signed)
Cardiology Office Note  Date: 10/08/2022   ID: Harleigh, Klute 1975-11-29, MRN [id number]  PCP:  Leone Payor, FNP  Cardiologist:  Marjo Bicker, MD Electrophysiologist:  None   Reason for Office Visit: Evaluation of skipped beats at the request of Jimmye Norman, FNP   History of Present Illness: Tabitha Spence is a 47 y.o. female known to have asthma, hypothyroidism was referred to cardiology clinic for evaluation of irregular heartbeat.  Patient started to have palpitations for a few months which are worsening recently to the point of occurring almost every day. She describes palpitations as her heart skipping a beat (feeling like a long pause) for a few seconds, intermittent.  Does not drink coffee, energy drinks, herbal supplements, alcohol.  Denies smoking cigarettes.  Denies other symptoms of chest pain, DOE, dizziness, syncope and leg swelling.  No sleep apnea symptoms.  Past Medical History:  Diagnosis Date   Asthma    Frequent headaches    History of chicken pox    Hyperlipidemia    Hypothyroidism    Irregular heart beat    Obesity 03/06/2013   Perennial allergic rhinitis    Prediabetes    Sinus tachycardia     Past Surgical History:  Procedure Laterality Date   APPENDECTOMY  1999   ORIF ANKLE FRACTURE Left 07/24/2018   Procedure: OPEN REDUCTION INTERNAL FIXATION (ORIF) LEFT LATERAL MALLEOLUS;  Surgeon: Tarry Kos, MD;  Location: [facility];  Service: Orthopedics;  Laterality: Left;    Current Outpatient Medications  Medication Sig Dispense Refill   AIRSUPRA 90-80 MCG/ACT AERO SMARTSIG:2 Puff(s) Via Inhaler Daily PRN     atorvastatin (LIPITOR) 10 MG tablet Take 10 mg by mouth daily.     fluticasone-salmeterol (ADVAIR) 250-50 MCG/ACT AEPB 1 puff 2 (two) times daily.     levocetirizine (XYZAL) 5 MG tablet      levonorgestrel (MIRENA, 52 MG,) 20 MCG/DAY IUD Mirena 20 mcg/24 hours (7 yrs) 52 mg intrauterine device  [facility]      levothyroxine (SYNTHROID) 112 MCG tablet Take 112 mcg by mouth every morning.     MELATONIN ER PO Take by mouth.     montelukast (SINGULAIR) 10 MG tablet TAKE 1 TABLET BY MOUTH EVERYDAY AT BEDTIME 30 tablet 2   Pumpkin Seed-Soy Germ (AZO BLADDER CONTROL/GO-LESS) CAPS Take by mouth.     No current facility-administered medications for this visit.   Allergies:  Other and Flonase [fluticasone propionate]   Social History: The patient  reports that she quit smoking about 23 years ago. Her smoking use included cigarettes. She has a 8.00 pack-year smoking history. She has never used smokeless tobacco. She reports that she does not currently use alcohol. She reports that she does not use drugs.   Family History: The patient's family history includes Arthritis in her maternal grandmother; Asthma in her mother; Cancer in her maternal grandfather and paternal grandmother; Hypertension in her mother; Stroke (age of onset: 71) in her maternal grandmother.   ROS:  Please see the history of present illness. Otherwise, complete review of systems is positive for none  All other systems are reviewed and negative.   Physical Exam: VS:  BP (!) 158/80   Pulse 85   Ht 5\' 6"  (1.676 m)   Wt 257 lb (116.6 kg)   SpO2 96%   BMI 41.48 kg/m , BMI Body mass index is 41.48 kg/m.  Wt Readings from Last 3 Encounters:  10/08/22 257 lb (  116.6 kg)  11/25/20 215 lb 9 oz (97.8 kg)  10/31/20 206 lb 6 oz (93.6 kg)    General: Patient appears comfortable at rest. HEENT: Conjunctiva and lids normal, oropharynx clear with moist mucosa. Neck: Supple, no elevated JVP or carotid bruits, no thyromegaly. Lungs: Clear to auscultation, nonlabored breathing at rest. Cardiac: Regular rate and rhythm, systolic murmur is present. Abdomen: Soft, nontender, no hepatomegaly, bowel sounds present, no guarding or rebound. Extremities: No pitting edema, distal pulses 2+. Skin: Warm and dry. Musculoskeletal: No  kyphosis. Neuropsychiatric: Alert and oriented x3, affect grossly appropriate.  Recent Labwork: No results found for requested labs within last 365 days.     Component Value Date/Time   CHOL 229 (H) 05/17/2020 0802   TRIG 184.0 (H) 05/17/2020 0802   HDL 73.50 05/17/2020 0802   CHOLHDL 3 05/17/2020 0802   VLDL 36.8 05/17/2020 0802   LDLCALC 119 (H) 05/17/2020 0802   LDLDIRECT 126.0 02/18/2019 0851    Assessment and Plan:  Patient is a 47 year old F known to have asthma, hypothyroidism was referred to cardiology clinic for evaluation of palpitations.  # Palpitations -Palpitations x ongoing for a few months, occurs almost daily lasting for a few seconds (feeling like her heart is skipping a beat, feels like a long pause. Does not drink coffee, energy drinks, alcohol, herbal supplements. Obtain 2-week event monitor, non-live.  # Systolic murmur -Could be innocent versus mild leakiness.  Obtain 2D echocardiogram.  I have spent a total of 45 minutes with patient reviewing chart, EKGs, labs and examining patient as well as establishing an assessment and plan that was discussed with the patient.  > 50% of time was spent in direct patient care.    Medication Adjustments/Labs and Tests Ordered: Current medicines are reviewed at length with the patient today.  Concerns regarding medicines are outlined above.   Tests Ordered: Orders Placed This Encounter  Procedures   EKG 12-Lead   ECHOCARDIOGRAM COMPLETE    Medication Changes: No orders of the defined types were placed in this encounter.   Disposition:  Follow up  pending results  Signed Trenae Brunke Verne Spurr, MD, 10/08/2022 12:25 PM    [facility] [address] [place], [place], [place] [address]

## 2022-10-10 ENCOUNTER — Encounter: Payer: Self-pay | Admitting: Allergy & Immunology

## 2022-10-10 ENCOUNTER — Other Ambulatory Visit: Payer: Self-pay

## 2022-10-10 ENCOUNTER — Ambulatory Visit: Payer: Medicaid Other | Admitting: Allergy & Immunology

## 2022-10-10 VITALS — BP 136/98 | HR 106 | Temp 98.4°F | Resp 18 | Ht 66.54 in | Wt 255.2 lb

## 2022-10-10 DIAGNOSIS — J4540 Moderate persistent asthma, uncomplicated: Secondary | ICD-10-CM | POA: Diagnosis not present

## 2022-10-10 DIAGNOSIS — J302 Other seasonal allergic rhinitis: Secondary | ICD-10-CM | POA: Diagnosis not present

## 2022-10-10 DIAGNOSIS — J3089 Other allergic rhinitis: Secondary | ICD-10-CM | POA: Diagnosis not present

## 2022-10-10 MED ORDER — RYALTRIS 665-25 MCG/ACT NA SUSP: NASAL | 5 refills | Status: DC

## 2022-10-10 MED ORDER — CARBINOXAMINE MALEATE 4 MG PO TABS: 4.0000 mg | ORAL_TABLET | Freq: Three times a day (TID) | ORAL | 0 refills | Status: AC | PRN

## 2022-10-10 NOTE — Progress Notes (Signed)
NEW PATIENT  Date of Service/Encounter:  10/10/22  Consult requested by: Leone Payor, FNP   Assessment:   Moderate persistent asthma, uncomplicated  Previous smoker  Seasonal and perennial allergic rhinitis (grasses, ragweed, weeds, trees, indoor molds, outdoor molds, dust mites, cat, dog, cockroach, and horse and mixed feathers and tobacco leaf)  Plan/Recommendations:   1. Moderate persistent asthma, uncomplicated - We did not do lung testing, but we will do that next time. - We are not going to make any medication changes at this time. - Daily controller medication(s): Advair 250/25mcg one puff twice daily - Prior to physical activity: AirSupra two puffs 10-15 minutes before physical activity. - Rescue medications: AirSupra 2 puffs every 4-6 hours as needed - Asthma control goals:  * Full participation in all desired activities (may need albuterol before activity) * Albuterol use two time or less a week on average (not counting use with activity) * Cough interfering with sleep two time or less a month * Oral steroids no more than once a year * No hospitalizations  2. Seasonal and perennial allergic rhinitis - Testing today showed: grasses, ragweed, weeds, trees, indoor molds, outdoor molds, dust mites, cat, dog, cockroach, and horse and mixed feathers and tobacco leaf - Copy of test results provided.  - Avoidance measures provided. - Continue with: Xyzal (levocetirizine) 5mg  tablet once daily - Start taking: Ryaltris (olopatadine/mometasone) two sprays per nostril 1-2 times daily as needed and carbinoxamine 4 mg every 8 hours as needed - You can use an extra dose of the antihistamine, if needed, for breakthrough symptoms.  - Consider nasal saline rinses 1-2 times daily to remove allergens from the nasal cavities as well as help with mucous clearance (this is especially helpful to do before the nasal sprays are given) - Strongly consider allergy shots as a means of  long-term control. - Allergy shots "re-train" and "reset" the immune system to ignore environmental allergens and decrease the resulting immune response to those allergens (sneezing, itchy watery eyes, runny nose, nasal congestion, etc).    - Allergy shots improve symptoms in 75-85% of patients.  - I would consider doing rush immunotherapy.   3. Return in about 6 weeks (around 11/21/2022). You can have the follow up appointment with Dr. Dellis Anes or a Nurse Practicioner (our Nurse Practitioners are excellent and always have Physician oversight!).    This note in its entirety was forwarded to the Provider who requested this consultation.  Subjective:   Tabitha Spence is a 47 y.o. female presenting today for evaluation of  Chief Complaint  Patient presents with   Allergic Rhinitis     Has tried over the counter medications but is still having sx   Rash    Rash after using spray on sunscreen     PASHANCE WILTSE has a history of the following: Patient Active Problem List   Diagnosis Date Noted   Moderate persistent asthma, uncomplicated 10/10/2022   Palpitations 10/08/2022   Cardiac murmur 10/08/2022   Patellofemoral pain syndrome of right knee 11/25/2020   COVID-19 virus infection 10/31/2020   Dyslipidemia 02/25/2019   Pain in left ankle and joints of left foot 08/07/2018   Displaced fracture of distal end of fibula 07/18/2018   Knee crepitus 10/23/2017   Health maintenance examination 09/03/2013   Obesity, Class I, BMI 30-34.9 03/06/2013   Hypothyroidism 03/06/2013   Asthma    Seasonal and perennial allergic rhinitis     History obtained from: chart review and patient.  Nonah Spence was referred by Leone Payor, FNP.     Tabitha Spence is a 47 y.o. female presenting for an evaluation of environmental allergies.   She does not get sinus infections at all.    Asthma/Respiratory Symptom History: She has a history of asthma. She is on Advair one puff twice daily. She is also on  the montelukast for her symptoms. She has been on this regimen for around 3 months or so. She was on Symbicort and was doing fine with that.   She is on AirSupra as needed for her rescue.   Allergic Rhinitis Symptom History: She has had problems since age 60. She has been having worsening problems despite allergy medications. She has tried all of the medications without relief . She has nasal sprays which make her itch for some reason. Her predominant symptoms includes rhinorrhea, congestion, itchy eyes, and throat. She reports that she has problems with itching and tears from all of her symptoms. Symptoms are all year long. She does have dogs and she thinks that she is allergic to them.   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 She has been doing that since 2018.   Otherwise, there is no history of other atopic diseases, including food allergies, drug allergies, stinging insect allergies, or contact dermatitis. There is no significant infectious history. Vaccinations are up to date.    Past Medical History: Patient Active Problem List   Diagnosis Date Noted   Moderate persistent asthma, uncomplicated 10/10/2022   Palpitations 10/08/2022   Cardiac murmur 10/08/2022   Patellofemoral pain syndrome of right knee 11/25/2020   COVID-19 virus infection 10/31/2020   Dyslipidemia 02/25/2019   Pain in left ankle and joints of left foot 08/07/2018   Displaced fracture of distal end of fibula 07/18/2018   Knee crepitus 10/23/2017   Health maintenance examination 09/03/2013   Obesity, Class I, BMI 30-34.9 03/06/2013   Hypothyroidism 03/06/2013   Asthma    Seasonal and perennial allergic rhinitis     Medication List:  Allergies as of 10/10/2022       Reactions   Other Rash   ?after epidural for both pregnancies   Flonase [fluticasone Propionate] Itching    Diffuse body itching        Medication List        Accurate as of October 10, 2022 11:46 AM. If you have any questions, ask your nurse or doctor.          Airsupra 90-80 MCG/ACT Aero Generic drug: Albuterol-Budesonide SMARTSIG:2 Puff(s) Via Inhaler Daily PRN   atorvastatin 10 MG tablet Commonly known as: LIPITOR Take 10 mg by mouth daily.   AZO Bladder Control/Go-Less Caps Take by mouth.   Carbinoxamine Maleate 4 MG Tabs Take 1 tablet (4 mg total) by mouth every 8 (eight) hours as needed. Started by: Alfonse Spruce, MD   fluticasone-salmeterol 250-50 MCG/ACT Aepb Commonly known as: ADVAIR 1 puff 2 (two) times daily.   levocetirizine 5 MG tablet Commonly known as: XYZAL   levothyroxine 112 MCG tablet Commonly known as: SYNTHROID Take 112 mcg by mouth every morning.   MELATONIN ER PO Take by mouth.   Mirena (52 MG) 20 MCG/DAY Iud Generic drug: levonorgestrel Mirena 20 mcg/24 hours (7 yrs) 52 mg intrauterine device  [facility]   montelukast 10 MG tablet Commonly  known as: SINGULAIR TAKE 1 TABLET BY MOUTH EVERYDAY AT BEDTIME   Ryaltris 665-25 MCG/ACT Susp Generic drug: Olopatadine-Mometasone Place two sprays per nostril 1-2 times daily as needed Started by: Alfonse Spruce, MD        Birth History: non-contributory  Developmental History: non-contributory  Past Surgical History: Past Surgical History:  Procedure Laterality Date   APPENDECTOMY  1999   ORIF ANKLE FRACTURE Left 07/24/2018   Procedure: OPEN REDUCTION INTERNAL FIXATION (ORIF) LEFT LATERAL MALLEOLUS;  Surgeon: Tarry Kos, MD;  Location: [facility];  Service: Orthopedics;  Laterality: Left;     Family History: Family History  Problem Relation Age of Onset   Allergic rhinitis Mother    Hypertension Mother    Asthma Mother    Allergic rhinitis Brother    Asthma Brother    Arthritis Maternal Grandmother    Stroke Maternal Grandmother 12    Cancer Maternal Grandfather        blood   Cancer Paternal Grandmother        breast   CAD Neg Hx    Diabetes Neg Hx      Social History: Midna lives at home with her family.  She lives in a house that is 48 years old.  There is carpeting throughout the home.  She has gas heating and central cooling.  There are dogs inside of the home.  There are no dust mite covers on the bedding.  There is no tobacco exposure.  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  There is no fume, chemical, or dust exposure.  She does not have a HEPA filter.  She was a smoker from 12 through 2001.   Review of Systems  Constitutional: Negative.  Negative for chills, fever, malaise/fatigue and weight loss.  HENT:  Positive for congestion and sinus pain. Negative for ear discharge and ear pain.        Positive for postnasal drip.  Positive for throat clearing.  Eyes:  Negative for pain, discharge and redness.  Respiratory:  Negative for cough, sputum production, shortness of breath and wheezing.   Cardiovascular: Negative.  Negative for chest pain and palpitations.  Gastrointestinal:  Negative for abdominal pain, constipation, diarrhea, heartburn, nausea and vomiting.  Skin: Negative.  Negative for itching and rash.  Neurological:  Negative for dizziness and headaches.  Endo/Heme/Allergies:  Positive for environmental allergies. Does not bruise/bleed easily.       Objective:   Blood pressure (!) 136/98, pulse (!) 106, temperature 98.4 F (36.9 C), resp. rate 18, height 5' 6.54" (1.69 m), weight 255 lb 4 oz (115.8 kg), SpO2 95 %. Body mass index is 40.54 kg/m.     Physical Exam Vitals reviewed.  Constitutional:      Appearance: She is well-developed.     Comments: Pleasant.  Cooperative with the exam.  HENT:     Head: Normocephalic and atraumatic.     Right Ear: Tympanic membrane, ear canal and external ear normal. No drainage, swelling or tenderness. Tympanic membrane is not  injected, scarred, erythematous, retracted or bulging.     Left Ear: Tympanic membrane, ear canal and external ear normal. No drainage, swelling or tenderness. Tympanic membrane is not injected, scarred, erythematous, retracted or bulging.     Nose: No nasal deformity, septal deviation, mucosal edema or rhinorrhea.     Right Turbinates: Enlarged, swollen and pale.     Left Turbinates: Enlarged, swollen and pale.  Right Sinus: No maxillary sinus tenderness or frontal sinus tenderness.     Left Sinus: No maxillary sinus tenderness or frontal sinus tenderness.     Mouth/Throat:     Lips: Pink.     Mouth: Mucous membranes are moist. Mucous membranes are not pale and not dry.     Pharynx: Uvula midline.     Comments: Marked cobblestoning. Eyes:     General: Allergic shiner present.        Right eye: No discharge.        Left eye: No discharge.     Conjunctiva/sclera: Conjunctivae normal.     Right eye: Right conjunctiva is not injected. No chemosis.    Left eye: Left conjunctiva is not injected. No chemosis.    Pupils: Pupils are equal, round, and reactive to light.  Cardiovascular:     Rate and Rhythm: Normal rate and regular rhythm.     Heart sounds: Normal heart sounds.  Pulmonary:     Effort: Pulmonary effort is normal. No tachypnea, accessory muscle usage or respiratory distress.     Breath sounds: Normal breath sounds. No wheezing, rhonchi or rales.     Comments: Moving air well in all lung fields. Chest:     Chest wall: No tenderness.  Abdominal:     Tenderness: There is no abdominal tenderness. There is no guarding or rebound.  Lymphadenopathy:     Head:     Right side of head: No submandibular, tonsillar or occipital adenopathy.     Left side of head: No submandibular, tonsillar or occipital adenopathy.     Cervical: No cervical adenopathy.  Skin:    Coloration: Skin is not pale.     Findings: No abrasion, erythema, petechiae or rash. Rash is not papular, urticarial or  vesicular.  Neurological:     Mental Status: She is alert.  Psychiatric:        Behavior: Behavior is cooperative.      Diagnostic studies:    Spirometry: results normal (FEV1: 3.22/107%, FVC: 3.90/104%, FEV1/FVC: 83%).    Spirometry consistent with normal pattern.   Allergy Studies:     Airborne Adult Perc - 10/10/22 1001     Time Antigen Placed 1001    Allergen Manufacturer Waynette Buttery    Location Back    Number of Test 55    1. Control-Buffer 50% Glycerol Negative    2. Control-Histamine 2+    3. Bahia 2+    4. [place] 2+    5. Johnson 2+    6. Kentucky Blue 4+    7. Meadow Fescue 4+    8. Perennial Rye 4+    9. Timothy 4+    10. Ragweed Mix 3+    11. Cocklebur 3+    12. Plantain,  English Negative    13. Baccharis Negative    14. Dog Fennel 3+    15. Russian Thistle 3+    16. Lamb's Quarters 2+    17. Sheep Sorrell 2+    18. Rough Pigweed Negative    19. Marsh Elder, Rough Negative    20. Mugwort, Common Negative    21. Box, Elder 2+    22. Cedar, red 2+    23. Sweet Gum 2+    24. Pecan Pollen 4+    25. Pine Mix 2+    26. Walnut, Black Pollen 3+    27. Red Mulberry Negative    28. Ash Mix 2+    29. Birch Mix Negative  30. Beech American Negative    31. Cottonwood, Guinea[place]iss[place] Negative    32. Hickory, White 4+    33. Maple Mix 2+    34. Oak, [place] Mix 2+    35. Sycamore Eastern 2+    36. Alternaria Alternata Negative    37. Cladosporium Herbarum Negative    38. Aspergillus Mix Negative    39. Penicillium Mix Negative    40. Bipolaris Sorokiniana (Helminthosporium) Negative    41. Drechslera Spicifera (Curvularia) Negative    42. Mucor Plumbeus 2+    43. Fusarium Moniliforme 2+    44. Aureobasidium Pullulans (pullulara) 2+    45. Rhizopus Oryzae Negative    46. Botrytis Cinera Negative    47. Epicoccum Nigrum Negative    48. Phoma Betae Negative    49. Dust Mite Mix 4+    50. Cat Hair 10,000 BAU/ml 3+    51.  Dog Epithelia 2+    52. Mixed Feathers  3+    53. Horse Epithelia 3+    54. Cockroach, German 3+    55. Tobacco Leaf 3+             Intradermal - 10/10/22 1022     Time Antigen Placed 1030    Allergen Manufacturer Greer    Location Arm    Number of Test 3    Control Negative    Mold 1 Negative    Mold 2 Negative             Allergy testing results were read and interpreted by myself, documented by clinical staff.         Malachi Bonds, MD [facility]

## 2022-10-10 NOTE — Patient Instructions (Addendum)
1. Moderate persistent asthma, uncomplicated - We did not do lung testing, but we will do that next time. - We are not going to make any medication changes at this time. - Daily controller medication(s): Advair 250/65mcg one puff twice daily - Prior to physical activity: AirSupra two puffs 10-15 minutes before physical activity. - Rescue medications: AirSupra 2 puffs every 4-6 hours as needed - Asthma control goals:  * Full participation in all desired activities (may need albuterol before activity) * Albuterol use two time or less a week on average (not counting use with activity) * Cough interfering with sleep two time or less a month * Oral steroids no more than once a year * No hospitalizations  2. Seasonal and perennial allergic rhinitis - Testing today showed: grasses, ragweed, weeds, trees, indoor molds, outdoor molds, dust mites, cat, dog, cockroach, and horse and mixed feathers and tobacco leaf - Copy of test results provided.  - Avoidance measures provided. - Continue with: Xyzal (levocetirizine) 5mg  tablet once daily - Start taking: Ryaltris (olopatadine/mometasone) two sprays per nostril 1-2 times daily as needed and carbinoxamine 4 mg every 8 hours as needed - You can use an extra dose of the antihistamine, if needed, for breakthrough symptoms.  - Consider nasal saline rinses 1-2 times daily to remove allergens from the nasal cavities as well as help with mucous clearance (this is especially helpful to do before the nasal sprays are given) - Strongly consider allergy shots as a means of long-term control. - Allergy shots "re-train" and "reset" the immune system to ignore environmental allergens and decrease the resulting immune response to those allergens (sneezing, itchy watery eyes, runny nose, nasal congestion, etc).    - Allergy shots improve symptoms in 75-85% of patients.  - I would consider doing rush immunotherapy.   3. Return in about 6 weeks (around 11/21/2022). You  can have the follow up appointment with Dr. Dellis Anes or a Nurse Practicioner (our Nurse Practitioners are excellent and always have Physician oversight!).    Please inform us of any Emergency Department visits, hospitalizations, or changes in symptoms. Call us before going to the ED for breathing or allergy symptoms since we might be able to fit you in for a sick visit. Feel free to contact us anytime with any questions, problems, or concerns.  It was a pleasure to meet you today!  Websites that have reliable patient information: 1. American Academy of Asthma, Allergy, and Immunology: www.aaaai.org 2. Food Allergy Research and Education (FARE): foodallergy.org 3. Mothers of Asthmatics: http://www.asthmacommunitynetwork.org 4. American College of Allergy, Asthma, and Immunology: www.acaai.org   COVID-19 Vaccine Information can be found at: [email or web address] For questions related to vaccine distribution or appointments, please email [email or web address] or call [phone].   We realize that you might be concerned about having an allergic reaction to the COVID19 vaccines. To help with that concern, WE ARE OFFERING THE COVID19 VACCINES IN OUR OFFICE! Ask the front desk for dates!     "Like" [place] on Facebook and Instagram for our latest updates!      A healthy democracy works best when [employer] participate! Make sure you are registered to vote! If you have moved or changed any of your contact information, you will need to get this updated before voting!  In some cases, you MAY be able to register to vote online: [email or web address]     Airborne Adult Perc - 10/10/22 1001     Time Antigen Placed 1001    Allergen Manufacturer Waynette Buttery  Location Back    Number of Test 55    1. Control-Buffer 50% Glycerol Negative    2. Control-Histamine 2+    3. Bahia 2+    4. [place] 2+    5. Johnson 2+     6. [place] Blue 4+    7. Meadow Fescue 4+    8. Perennial Rye 4+    9. Timothy 4+    10. Ragweed Mix 3+    11. Cocklebur 3+    12. Plantain,  English Negative    13. Baccharis Negative    14. Dog Fennel 3+    15. Russian Thistle 3+    16. Lamb's Quarters 2+    17. Sheep Sorrell 2+    18. Rough Pigweed Negative    19. Marsh Elder, Rough Negative    20. Mugwort, Common Negative    21. Box, Elder 2+    22. Cedar, red 2+    23. Sweet Gum 2+    24. Pecan Pollen 4+    25. Pine Mix 2+    26. Walnut, Black Pollen 3+    27. Red Mulberry Negative    28. Ash Mix 2+    29. Birch Mix Negative    30. Beech American Negative    31. Cottonwood, [place] Negative    32. Hickory, White 4+    33. Maple Mix 2+    34. Oak, [place] Mix 2+    35. Sycamore Eastern 2+    36. Alternaria Alternata Negative    37. Cladosporium Herbarum Negative    38. Aspergillus Mix Negative    39. Penicillium Mix Negative    40. Bipolaris Sorokiniana (Helminthosporium) Negative    41. Drechslera Spicifera (Curvularia) Negative    42. Mucor Plumbeus 2+    43. Fusarium Moniliforme 2+    44. Aureobasidium Pullulans (pullulara) 2+    45. Rhizopus Oryzae Negative    46. Botrytis Cinera Negative    47. Epicoccum Nigrum Negative    48. Phoma Betae Negative    49. Dust Mite Mix 4+    50. Cat Hair 10,000 BAU/ml 3+    51.  Dog Epithelia 2+    52. Mixed Feathers 3+    53. Horse Epithelia 3+    54. Cockroach, German 3+    55. Tobacco Leaf 3+             Intradermal - 10/10/22 1022     Time Antigen Placed 1030    Allergen Manufacturer Greer    Location Arm    Number of Test 3    Control Negative    Mold 1 Negative    Mold 2 Negative             Reducing Pollen Exposure  The American Academy of Allergy, Asthma and Immunology suggests the following steps to reduce your exposure to pollen during allergy seasons.    Do not hang sheets or clothing out to dry; pollen may collect on these items. Do not  mow lawns or spend time around freshly cut grass; mowing stirs up pollen. Keep windows closed at night.  Keep car windows closed while driving. Minimize morning activities outdoors, a time when pollen counts are usually at their highest. Stay indoors as much as possible when pollen counts or humidity is high and on windy days when pollen tends to remain in the air longer. Use air conditioning when possible.  Many air conditioners have filters that trap the pollen spores. Use a  HEPA room air filter to remove pollen form the indoor air you breathe.  Control of Mold Allergen   Mold and fungi can grow on a variety of surfaces provided certain temperature and moisture conditions exist.  Outdoor molds grow on plants, decaying vegetation and soil.  The major outdoor mold, Alternaria and Cladosporium, are found in very high numbers during hot and dry conditions.  Generally, a late Summer - Fall peak is seen for common outdoor fungal spores.  Rain will temporarily lower outdoor mold spore count, but counts rise rapidly when the rainy period ends.  The most important indoor molds are Aspergillus and Penicillium.  Dark, humid and poorly ventilated basements are ideal sites for mold growth.  The next most common sites of mold growth are the bathroom and the kitchen.  Outdoor (Seasonal) Mold Control   Use air conditioning and keep windows closed Avoid exposure to decaying vegetation. Avoid leaf raking. Avoid grain handling. Consider wearing a face mask if working in moldy areas.    Indoor (Perennial) Mold Control    Maintain humidity below 50%. Clean washable surfaces with 5% bleach solution. Remove sources e.g. contaminated carpets.    Control of Dog or Cat Allergen  Avoidance is the best way to manage a dog or cat allergy. If you have a dog or cat and are allergic to dog or cats, consider removing the dog or cat from the home. If you have a dog or cat but don't want to find it a new home, or if  your family wants a pet even though someone in the household is allergic, here are some strategies that may help keep symptoms at bay:  Keep the pet out of your bedroom and restrict it to only a few rooms. Be advised that keeping the dog or cat in only one room will not limit the allergens to that room. Don't pet, hug or kiss the dog or cat; if you do, wash your hands with soap and water. High-efficiency particulate air (HEPA) cleaners run continuously in a bedroom or living room can reduce allergen levels over time. Regular use of a high-efficiency vacuum cleaner or a central vacuum can reduce allergen levels. Giving your dog or cat a bath at least once a week can reduce airborne allergen.  Control of Dust Mite Allergen    Dust mites play a major role in allergic asthma and rhinitis.  They occur in environments with high humidity wherever human skin is found.  Dust mites absorb humidity from the atmosphere (ie, they do not drink) and feed on organic matter (including shed human and animal skin).  Dust mites are a microscopic type of insect that you cannot see with the naked eye.  High levels of dust mites have been detected from mattresses, pillows, carpets, upholstered furniture, bed covers, clothes, soft toys and any woven material.  The principal allergen of the dust mite is found in its feces.  A gram of dust may contain 1,000 mites and 250,000 fecal particles.  Mite antigen is easily measured in the air during house cleaning activities.  Dust mites do not bite and do not cause harm to humans, other than by triggering allergies/asthma.    Ways to decrease your exposure to dust mites in your home:  Encase mattresses, box springs and pillows with a mite-impermeable barrier or cover   Wash sheets, blankets and drapes weekly in hot water (130 F) with detergent and dry them in a dryer on the hot setting.  Have  the room cleaned frequently with a vacuum cleaner and a damp dust-mop.  For carpeting or  rugs, vacuuming with a vacuum cleaner equipped with a high-efficiency particulate air (HEPA) filter.  The dust mite allergic individual should not be in a room which is being cleaned and should wait 1 hour after cleaning before going into the room. Do not sleep on upholstered furniture (eg, couches).   If possible removing carpeting, upholstered furniture and drapery from the home is ideal.  Horizontal blinds should be eliminated in the rooms where the person spends the most time (bedroom, study, television room).  Washable vinyl, roller-type shades are optimal. Remove all non-washable stuffed toys from the bedroom.  Wash stuffed toys weekly like sheets and blankets above.   Reduce indoor humidity to less than 50%.  Inexpensive humidity monitors can be purchased at most hardware stores.  Do not use a humidifier as can make the problem worse and are not recommended.  Control of Cockroach Allergen  Cockroach allergen has been identified as an important cause of acute attacks of asthma, especially in urban settings.  There are fifty-five species of cockroach that exist in the [place], however only three, the [place], [place] species produce allergen that can affect patients with Asthma.  Allergens can be obtained from fecal particles, egg casings and secretions from cockroaches.    Remove food sources. Reduce access to water. Seal access and entry points. Spray runways with 0.5-1% Diazinon or Chlorpyrifos Blow boric acid power under stoves and refrigerator. Place bait stations (hydramethylnon) at feeding sites.  Allergy Shots  Allergies are the result of a chain reaction that starts in the immune system. Your immune system controls how your body defends itself. For instance, if you have an allergy to pollen, your immune system identifies pollen as an invader or allergen. Your immune system overreacts by producing antibodies called Immunoglobulin E (IgE). These antibodies travel to  cells that release chemicals, causing an allergic reaction.  The concept behind allergy immunotherapy, whether it is received in the form of shots or tablets, is that the immune system can be desensitized to specific allergens that trigger allergy symptoms. Although it requires time and patience, the payback can be long-term relief. Allergy injections contain a dilute solution of those substances that you are allergic to based upon your skin testing and allergy history.   How Do Allergy Shots Work?  Allergy shots work much like a vaccine. Your body responds to injected amounts of a particular allergen given in increasing doses, eventually developing a resistance and tolerance to it. Allergy shots can lead to decreased, minimal or no allergy symptoms.  There generally are two phases: build-up and maintenance. Build-up often ranges from three to six months and involves receiving injections with increasing amounts of the allergens. The shots are typically given once or twice a week, though more rapid build-up schedules are sometimes used.  The maintenance phase begins when the most effective dose is reached. This dose is different for each person, depending on how allergic you are and your response to the build-up injections. Once the maintenance dose is reached, there are longer periods between injections, typically two to four weeks.  Occasionally doctors give cortisone-type shots that can temporarily reduce allergy symptoms. These types of shots are different and should not be confused with allergy immunotherapy shots.  Who Can Be Treated with Allergy Shots?  Allergy shots may be a good treatment approach for people with allergic rhinitis (hay fever), allergic asthma, conjunctivitis (  eye allergy) or stinging insect allergy.   Before deciding to begin allergy shots, you should consider:   The length of allergy season and the severity of your symptoms  Whether medications and/or changes to your  environment can control your symptoms  Your desire to avoid long-term medication use  Time: allergy immunotherapy requires a major time commitment  Cost: may vary depending on your insurance coverage  Allergy shots for children age 37 and older are effective and often well tolerated. They might prevent the onset of new allergen sensitivities or the progression to asthma.  Allergy shots are not started on patients who are pregnant but can be continued on patients who become pregnant while receiving them. In some patients with other medical conditions or who take certain common medications, allergy shots may be of risk. It is important to mention other medications you talk to your allergist.   What are the two types of build-ups offered:   RUSH or Rapid Desensitization -- one day of injections lasting from 8:30-4:30pm, injections every 1 hour.  Approximately half of the build-up process is completed in that one day.  The following week, normal build-up is resumed, and this entails ~16 visits either weekly or twice weekly, until reaching your "maintenance dose" which is continued weekly until eventually getting spaced out to every month for a duration of 3 to 5 years. The regular build-up appointments are nurse visits where the injections are administered, followed by required monitoring for 30 minutes.    Traditional build-up -- weekly visits for 6 -12 months until reaching "maintenance dose", then continue weekly until eventually spacing out to every 4 weeks as above. At these appointments, the injections are administered, followed by required monitoring for 30 minutes.     Either way is acceptable, and both are equally effective. With the rush protocol, the advantage is that less time is spent here for injections overall AND you would also reach maintenance dosing faster (which is when the clinical benefit starts to become more apparent). Not everyone is a candidate for rapid desensitization.    IF we proceed with the RUSH protocol, there are premedications which must be taken the day before and the day after the rush only (this includes antihistamines, steroids, and Singulair).  After the rush day, no prednisone or Singulair is required, and we just recommend antihistamines taken on your injection day.  What Is An Estimate of the Costs?  If you are interested in starting allergy injections, please check with your insurance company about your coverage for both allergy vial sets and allergy injections.  Please do so prior to making the appointment to start injections.  The following are CPT codes to give to your insurance company. These are the amounts we BILL to the insurance company, but the amount YOU WILL PAY and WE RECEIVE IS SUBSTANTIALLY LESS and depends on the contracts we have with different insurance companies.   Amount Billed to Insurance One allergy vial set  CPT 95165   $ 1200     Two allergy vial set  CPT 95165   $ 2400     Three allergy vial set  CPT 95165   $ 3600     One injection   CPT 95115   $ 35  Two injections   CPT 95117   $ 40 RUSH (Rapid Desensitization) CPT 95180 x 8 hours $500/hour  Regarding the allergy injections, your co-pay may or may not apply with each injection, so please confirm this with  your insurance company. When you start allergy injections, 1 or 2 sets of vials are made based on your allergies.  Not all patients can be on one set of vials. A set of vials lasts 6 months to a year depending on how quickly you can proceed with your build-up of your allergy injections. Vials are personalized for each patient depending on their specific allergens.  How often are allergy injection given during the build-up period?   Injections are given at least weekly during the build-up period until your maintenance dose is achieved. Per the doctor's discretion, you may have the option of getting allergy injections two times per week during the build-up period.  However, there must be at least 48 hours between injections. The build-up period is usually completed within 6-12 months depending on your ability to schedule injections and for adjustments for reactions. When maintenance dose is reached, your injection schedule is gradually changed to every two weeks and later to every three weeks. Injections will then continue every 4 weeks. Usually, injections are continued for a total of 3-5 years.   When Will I Feel Better?  Some may experience decreased allergy symptoms during the build-up phase. For others, it may take as long as 12 months on the maintenance dose. If there is no improvement after a year of maintenance, your allergist will discuss other treatment options with you.  If you aren't responding to allergy shots, it may be because there is not enough dose of the allergen in your vaccine or there are missing allergens that were not identified during your allergy testing. Other reasons could be that there are high levels of the allergen in your environment or major exposure to non-allergic triggers like tobacco smoke.  What Is the Length of Treatment?  Once the maintenance dose is reached, allergy shots are generally continued for three to five years. The decision to stop should be discussed with your allergist at that time. Some people may experience a permanent reduction of allergy symptoms. Others may relapse and a longer course of allergy shots can be considered.  What Are the Possible Reactions?  The two types of adverse reactions that can occur with allergy shots are local and systemic. Common local reactions include very mild redness and swelling at the injection site, which can happen immediately or several hours after. Report a delayed reaction from your last injection. These include arm swelling or runny nose, watery eyes or cough that occurs within 12-24 hours after injection. A systemic reaction, which is less common, affects the entire body  or a particular body system. They are usually mild and typically respond quickly to medications. Signs include increased allergy symptoms such as sneezing, a stuffy nose or hives.   Rarely, a serious systemic reaction called anaphylaxis can develop. Symptoms include swelling in the throat, wheezing, a feeling of tightness in the chest, nausea or dizziness. Most serious systemic reactions develop within 30 minutes of allergy shots. This is why it is strongly recommended you wait in your doctor's office for 30 minutes after your injections. Your allergist is trained to watch for reactions, and his or her staff is trained and equipped with the proper medications to identify and treat them.   Report to the nurse immediately if you experience any of the following symptoms: swelling, itching or redness of the skin, hives, watery eyes/nose, breathing difficulty, excessive sneezing, coughing, stomach pain, diarrhea, or light headedness. These symptoms may occur within 15-20 minutes after injection and may require medication.  Who Should Administer Allergy Shots?  The preferred location for receiving shots is your prescribing allergist's office. Injections can sometimes be given at another facility where the physician and staff are trained to recognize and treat reactions, and have received instructions by your prescribing allergist.  What if I am late for an injection?   Injection dose will be adjusted depending upon how many days or weeks you are late for your injection.   What if I am sick?   Please report any illness to the nurse before receiving injections. She may adjust your dose or postpone injections depending on your symptoms. If you have fever, flu, sinus infection or chest congestion it is best to postpone allergy injections until you are better. Never get an allergy injection if your asthma is causing you problems. If your symptoms persist, seek out medical care to get your health problem under  control.  What If I am or Become Pregnant:  Women that become pregnant should schedule an appointment with [facility] before receiving any further allergy injections.

## 2022-11-20 ENCOUNTER — Ambulatory Visit (HOSPITAL_COMMUNITY)
Admission: RE | Admit: 2022-11-20 | Discharge: 2022-11-20 | Disposition: A | Discharge status: Home / Self Care | Payer: No Typology Code available for payment source | Source: Ambulatory Visit | Attending: Internal Medicine | Admitting: Internal Medicine

## 2022-11-20 DIAGNOSIS — R011 Cardiac murmur, unspecified: Secondary | ICD-10-CM | POA: Insufficient documentation

## 2022-11-20 LAB — ECHOCARDIOGRAM COMPLETE
Area-P 1/2: 3.6 cm2
S' Lateral: 2.8 cm

## 2022-11-20 NOTE — Progress Notes (Signed)
*  PRELIMINARY RESULTS* Echocardiogram 2D Echocardiogram has been performed.  Stacey Drain 11/20/2022, 9:12 AM

## 2023-01-30 ENCOUNTER — Telehealth: Payer: Self-pay

## 2023-01-30 MED ORDER — DILTIAZEM HCL ER 60 MG PO CP12: 60.0000 mg | ORAL_CAPSULE | Freq: Three times a day (TID) | ORAL | 0 refills | Status: DC | PRN

## 2023-01-30 NOTE — Telephone Encounter (Signed)
Patient informed and verbalized understanding of plan. 

## 2023-01-30 NOTE — Telephone Encounter (Signed)
-----   Message from Tabitha Spence sent at 01/30/2023 10:36 AM EDT ----- Event monitor showed 25 runs of brief SVT, fastest lasting 1.2 seconds and longest lasting 14.6 seconds.  Patient symptoms correlated with normal sinus rhythm, brief SVT and ectopy.  Start diltiazem 60 mg every 8 hours.  Schedule follow-up in 6 months.

## 2023-03-11 ENCOUNTER — Other Ambulatory Visit: Payer: Self-pay | Admitting: Internal Medicine

## 2023-03-28 ENCOUNTER — Other Ambulatory Visit: Payer: Self-pay

## 2023-03-28 MED ORDER — RYALTRIS 665-25 MCG/ACT NA SUSP: NASAL | 5 refills | Status: AC

## 2023-09-19 DIAGNOSIS — Z01419 Encounter for gynecological examination (general) (routine) without abnormal findings: Secondary | ICD-10-CM | POA: Diagnosis not present

## 2023-09-19 DIAGNOSIS — Z1231 Encounter for screening mammogram for malignant neoplasm of breast: Secondary | ICD-10-CM | POA: Diagnosis not present

## 2023-09-20 DIAGNOSIS — E785 Hyperlipidemia, unspecified: Secondary | ICD-10-CM | POA: Diagnosis not present

## 2023-09-20 DIAGNOSIS — R7303 Prediabetes: Secondary | ICD-10-CM | POA: Diagnosis not present

## 2023-09-20 DIAGNOSIS — E039 Hypothyroidism, unspecified: Secondary | ICD-10-CM | POA: Diagnosis not present

## 2023-10-02 DIAGNOSIS — Z23 Encounter for immunization: Secondary | ICD-10-CM | POA: Diagnosis not present

## 2023-10-02 DIAGNOSIS — E039 Hypothyroidism, unspecified: Secondary | ICD-10-CM | POA: Diagnosis not present

## 2023-10-02 DIAGNOSIS — J309 Allergic rhinitis, unspecified: Secondary | ICD-10-CM | POA: Diagnosis not present

## 2023-10-02 DIAGNOSIS — R7303 Prediabetes: Secondary | ICD-10-CM | POA: Diagnosis not present

## 2023-10-02 DIAGNOSIS — J45909 Unspecified asthma, uncomplicated: Secondary | ICD-10-CM | POA: Diagnosis not present

## 2023-10-02 DIAGNOSIS — E785 Hyperlipidemia, unspecified: Secondary | ICD-10-CM | POA: Diagnosis not present

## 2023-10-23 DIAGNOSIS — E039 Hypothyroidism, unspecified: Secondary | ICD-10-CM | POA: Diagnosis not present

## 2023-10-23 DIAGNOSIS — E785 Hyperlipidemia, unspecified: Secondary | ICD-10-CM | POA: Diagnosis not present

## 2024-03-31 DIAGNOSIS — R7303 Prediabetes: Secondary | ICD-10-CM | POA: Diagnosis not present

## 2024-03-31 DIAGNOSIS — E785 Hyperlipidemia, unspecified: Secondary | ICD-10-CM | POA: Diagnosis not present

## 2024-04-07 DIAGNOSIS — Z23 Encounter for immunization: Secondary | ICD-10-CM | POA: Diagnosis not present

## 2024-04-07 DIAGNOSIS — E785 Hyperlipidemia, unspecified: Secondary | ICD-10-CM | POA: Diagnosis not present

## 2024-04-07 DIAGNOSIS — H9192 Unspecified hearing loss, left ear: Secondary | ICD-10-CM | POA: Diagnosis not present

## 2024-04-07 DIAGNOSIS — E039 Hypothyroidism, unspecified: Secondary | ICD-10-CM | POA: Diagnosis not present

## 2024-04-07 DIAGNOSIS — J45909 Unspecified asthma, uncomplicated: Secondary | ICD-10-CM | POA: Diagnosis not present

## 2024-04-07 DIAGNOSIS — J309 Allergic rhinitis, unspecified: Secondary | ICD-10-CM | POA: Diagnosis not present
